# Patient Record
Sex: Female | Born: 1960 | Race: White | Hispanic: No | Marital: Married | Smoking: Never smoker
Health system: Southern US, Community
[De-identification: ages and names within clinical notes are randomized; demographics above are authoritative.]

## PROBLEM LIST (undated history)

## (undated) DIAGNOSIS — E063 Autoimmune thyroiditis: Secondary | ICD-10-CM

## (undated) DIAGNOSIS — G43909 Migraine, unspecified, not intractable, without status migrainosus: Secondary | ICD-10-CM

## (undated) DIAGNOSIS — K5792 Diverticulitis of intestine, part unspecified, without perforation or abscess without bleeding: Secondary | ICD-10-CM

## (undated) DIAGNOSIS — C801 Malignant (primary) neoplasm, unspecified: Secondary | ICD-10-CM

## (undated) HISTORY — DX: Diverticulitis of intestine, part unspecified, without perforation or abscess without bleeding: K57.92

## (undated) HISTORY — PX: BREAST CYST ASPIRATION: SHX578

## (undated) HISTORY — DX: Migraine, unspecified, not intractable, without status migrainosus: G43.909

## (undated) HISTORY — PX: VAGINAL HYSTERECTOMY: SUR661

## (undated) HISTORY — DX: Autoimmune thyroiditis: E06.3

## (undated) HISTORY — PX: HAND SURGERY: SHX662

## (undated) HISTORY — PX: GALLBLADDER SURGERY: SHX652

## (undated) HISTORY — DX: Malignant (primary) neoplasm, unspecified: C80.1

## (undated) HISTORY — PX: HERNIA REPAIR: SHX51

---

## 1997-06-11 ENCOUNTER — Encounter: Admission: RE | Admit: 1997-06-11 | Discharge: 1997-09-09 | Payer: Self-pay | Admitting: Specialist

## 1998-01-11 ENCOUNTER — Encounter: Payer: Self-pay | Admitting: Emergency Medicine

## 1998-01-11 ENCOUNTER — Emergency Department (HOSPITAL_COMMUNITY): Admission: EM | Admit: 1998-01-11 | Discharge: 1998-01-11 | Payer: Self-pay | Admitting: Emergency Medicine

## 1998-06-30 ENCOUNTER — Other Ambulatory Visit: Admission: RE | Admit: 1998-06-30 | Discharge: 1998-06-30 | Payer: Self-pay | Admitting: Obstetrics and Gynecology

## 1998-12-26 ENCOUNTER — Other Ambulatory Visit: Admission: RE | Admit: 1998-12-26 | Discharge: 1998-12-26 | Payer: Self-pay | Admitting: Obstetrics and Gynecology

## 1999-05-23 ENCOUNTER — Other Ambulatory Visit: Admission: RE | Admit: 1999-05-23 | Discharge: 1999-05-23 | Payer: Self-pay | Admitting: Obstetrics and Gynecology

## 1999-11-08 ENCOUNTER — Other Ambulatory Visit: Admission: RE | Admit: 1999-11-08 | Discharge: 1999-11-08 | Payer: Self-pay | Admitting: Obstetrics and Gynecology

## 2000-05-10 ENCOUNTER — Other Ambulatory Visit: Admission: RE | Admit: 2000-05-10 | Discharge: 2000-05-10 | Payer: Self-pay | Admitting: Obstetrics and Gynecology

## 2001-05-12 ENCOUNTER — Other Ambulatory Visit: Admission: RE | Admit: 2001-05-12 | Discharge: 2001-05-12 | Payer: Self-pay | Admitting: Obstetrics and Gynecology

## 2001-05-19 ENCOUNTER — Encounter: Admission: RE | Admit: 2001-05-19 | Discharge: 2001-05-19 | Payer: Self-pay | Admitting: Obstetrics and Gynecology

## 2001-05-19 ENCOUNTER — Encounter: Payer: Self-pay | Admitting: Obstetrics and Gynecology

## 2001-11-13 ENCOUNTER — Encounter: Admission: RE | Admit: 2001-11-13 | Discharge: 2001-11-13 | Payer: Self-pay | Admitting: Obstetrics and Gynecology

## 2001-11-13 ENCOUNTER — Encounter: Payer: Self-pay | Admitting: Obstetrics and Gynecology

## 2001-11-13 ENCOUNTER — Other Ambulatory Visit: Admission: RE | Admit: 2001-11-13 | Discharge: 2001-11-13 | Payer: Self-pay | Admitting: Radiology

## 2002-05-18 ENCOUNTER — Other Ambulatory Visit: Admission: RE | Admit: 2002-05-18 | Discharge: 2002-05-18 | Payer: Self-pay | Admitting: Obstetrics and Gynecology

## 2002-10-21 ENCOUNTER — Encounter: Admission: RE | Admit: 2002-10-21 | Discharge: 2002-10-21 | Payer: Self-pay | Admitting: Obstetrics and Gynecology

## 2002-10-21 ENCOUNTER — Encounter: Payer: Self-pay | Admitting: Obstetrics and Gynecology

## 2003-06-04 ENCOUNTER — Other Ambulatory Visit: Admission: RE | Admit: 2003-06-04 | Discharge: 2003-06-04 | Payer: Self-pay | Admitting: Obstetrics and Gynecology

## 2003-12-20 ENCOUNTER — Encounter: Admission: RE | Admit: 2003-12-20 | Discharge: 2003-12-20 | Payer: Self-pay | Admitting: Obstetrics and Gynecology

## 2003-12-24 ENCOUNTER — Encounter: Admission: RE | Admit: 2003-12-24 | Discharge: 2003-12-24 | Payer: Self-pay | Admitting: Obstetrics and Gynecology

## 2004-06-30 ENCOUNTER — Other Ambulatory Visit: Admission: RE | Admit: 2004-06-30 | Discharge: 2004-06-30 | Payer: Self-pay | Admitting: Obstetrics and Gynecology

## 2005-01-11 ENCOUNTER — Encounter: Admission: RE | Admit: 2005-01-11 | Discharge: 2005-01-11 | Payer: Self-pay | Admitting: Obstetrics and Gynecology

## 2006-02-06 ENCOUNTER — Encounter: Admission: RE | Admit: 2006-02-06 | Discharge: 2006-02-06 | Payer: Self-pay | Admitting: Obstetrics and Gynecology

## 2007-04-10 ENCOUNTER — Encounter: Admission: RE | Admit: 2007-04-10 | Discharge: 2007-04-10 | Payer: Self-pay | Admitting: Obstetrics and Gynecology

## 2008-04-12 ENCOUNTER — Encounter: Admission: RE | Admit: 2008-04-12 | Discharge: 2008-04-12 | Payer: Self-pay | Admitting: Obstetrics and Gynecology

## 2009-04-14 ENCOUNTER — Encounter: Admission: RE | Admit: 2009-04-14 | Discharge: 2009-04-14 | Payer: Self-pay | Admitting: Obstetrics and Gynecology

## 2009-04-19 ENCOUNTER — Encounter: Admission: RE | Admit: 2009-04-19 | Discharge: 2009-04-19 | Payer: Self-pay | Admitting: Obstetrics and Gynecology

## 2010-02-19 ENCOUNTER — Encounter: Payer: Self-pay | Admitting: Obstetrics and Gynecology

## 2010-05-03 ENCOUNTER — Other Ambulatory Visit: Payer: Self-pay | Admitting: Obstetrics and Gynecology

## 2010-05-03 DIAGNOSIS — Z1231 Encounter for screening mammogram for malignant neoplasm of breast: Secondary | ICD-10-CM

## 2010-06-06 ENCOUNTER — Ambulatory Visit
Admission: RE | Admit: 2010-06-06 | Discharge: 2010-06-06 | Disposition: A | Discharge status: Home / Self Care | Payer: 59 | Source: Ambulatory Visit | Attending: Obstetrics and Gynecology | Admitting: Obstetrics and Gynecology

## 2010-06-06 DIAGNOSIS — Z1231 Encounter for screening mammogram for malignant neoplasm of breast: Secondary | ICD-10-CM

## 2011-01-03 ENCOUNTER — Other Ambulatory Visit: Payer: Self-pay | Admitting: Internal Medicine

## 2011-01-03 DIAGNOSIS — R42 Dizziness and giddiness: Secondary | ICD-10-CM

## 2011-01-03 DIAGNOSIS — R51 Headache: Secondary | ICD-10-CM

## 2011-01-07 ENCOUNTER — Ambulatory Visit
Admission: RE | Admit: 2011-01-07 | Discharge: 2011-01-07 | Disposition: A | Discharge status: Home / Self Care | Payer: 59 | Source: Ambulatory Visit | Attending: Internal Medicine | Admitting: Internal Medicine

## 2011-01-07 DIAGNOSIS — R51 Headache: Secondary | ICD-10-CM

## 2011-01-07 DIAGNOSIS — R42 Dizziness and giddiness: Secondary | ICD-10-CM

## 2011-01-07 MED ORDER — GADOBENATE DIMEGLUMINE 529 MG/ML IV SOLN
17.0000 mL | Freq: Once | INTRAVENOUS | Status: AC | PRN
  Administered 2011-01-07: 17 mL via INTRAVENOUS

## 2011-01-23 ENCOUNTER — Ambulatory Visit (INDEPENDENT_AMBULATORY_CARE_PROVIDER_SITE_OTHER): Payer: 59

## 2011-01-23 DIAGNOSIS — J029 Acute pharyngitis, unspecified: Secondary | ICD-10-CM

## 2011-01-23 DIAGNOSIS — R509 Fever, unspecified: Secondary | ICD-10-CM

## 2011-01-23 DIAGNOSIS — IMO0001 Reserved for inherently not codable concepts without codable children: Secondary | ICD-10-CM

## 2011-05-25 ENCOUNTER — Other Ambulatory Visit: Payer: Self-pay | Admitting: Obstetrics and Gynecology

## 2011-05-25 DIAGNOSIS — Z1231 Encounter for screening mammogram for malignant neoplasm of breast: Secondary | ICD-10-CM

## 2011-07-02 ENCOUNTER — Ambulatory Visit
Admission: RE | Admit: 2011-07-02 | Discharge: 2011-07-02 | Disposition: A | Discharge status: Home / Self Care | Payer: 59 | Source: Ambulatory Visit | Attending: Obstetrics and Gynecology | Admitting: Obstetrics and Gynecology

## 2011-07-02 DIAGNOSIS — Z1231 Encounter for screening mammogram for malignant neoplasm of breast: Secondary | ICD-10-CM

## 2011-07-05 ENCOUNTER — Other Ambulatory Visit: Payer: Self-pay | Admitting: Obstetrics and Gynecology

## 2011-07-05 DIAGNOSIS — R928 Other abnormal and inconclusive findings on diagnostic imaging of breast: Secondary | ICD-10-CM

## 2011-07-09 ENCOUNTER — Ambulatory Visit
Admission: RE | Admit: 2011-07-09 | Discharge: 2011-07-09 | Disposition: A | Discharge status: Home / Self Care | Payer: 59 | Source: Ambulatory Visit | Attending: Obstetrics and Gynecology | Admitting: Obstetrics and Gynecology

## 2011-07-09 DIAGNOSIS — R928 Other abnormal and inconclusive findings on diagnostic imaging of breast: Secondary | ICD-10-CM

## 2011-07-13 ENCOUNTER — Other Ambulatory Visit: Payer: 59

## 2012-06-11 ENCOUNTER — Other Ambulatory Visit: Payer: Self-pay

## 2012-06-11 DIAGNOSIS — Z1231 Encounter for screening mammogram for malignant neoplasm of breast: Secondary | ICD-10-CM

## 2012-07-24 ENCOUNTER — Ambulatory Visit
Admission: RE | Admit: 2012-07-24 | Discharge: 2012-07-24 | Disposition: A | Discharge status: Home / Self Care | Payer: 59 | Source: Ambulatory Visit

## 2012-07-24 DIAGNOSIS — Z1231 Encounter for screening mammogram for malignant neoplasm of breast: Secondary | ICD-10-CM

## 2013-06-16 ENCOUNTER — Other Ambulatory Visit: Payer: Self-pay

## 2013-06-16 DIAGNOSIS — Z1231 Encounter for screening mammogram for malignant neoplasm of breast: Secondary | ICD-10-CM

## 2013-07-27 ENCOUNTER — Encounter (INDEPENDENT_AMBULATORY_CARE_PROVIDER_SITE_OTHER): Payer: Self-pay

## 2013-07-27 ENCOUNTER — Ambulatory Visit
Admission: RE | Admit: 2013-07-27 | Discharge: 2013-07-27 | Disposition: A | Discharge status: Home / Self Care | Payer: 59 | Source: Ambulatory Visit

## 2013-07-27 DIAGNOSIS — Z1231 Encounter for screening mammogram for malignant neoplasm of breast: Secondary | ICD-10-CM

## 2013-11-13 ENCOUNTER — Other Ambulatory Visit: Payer: Self-pay

## 2014-08-23 ENCOUNTER — Other Ambulatory Visit: Payer: Self-pay | Admitting: Obstetrics and Gynecology

## 2014-08-23 DIAGNOSIS — Z1231 Encounter for screening mammogram for malignant neoplasm of breast: Secondary | ICD-10-CM

## 2014-09-27 ENCOUNTER — Ambulatory Visit
Admission: RE | Admit: 2014-09-27 | Discharge: 2014-09-27 | Disposition: A | Discharge status: Home / Self Care | Payer: 59 | Source: Ambulatory Visit | Attending: Obstetrics and Gynecology | Admitting: Obstetrics and Gynecology

## 2014-09-27 DIAGNOSIS — Z1231 Encounter for screening mammogram for malignant neoplasm of breast: Secondary | ICD-10-CM

## 2014-09-30 ENCOUNTER — Other Ambulatory Visit: Payer: Self-pay | Admitting: Obstetrics and Gynecology

## 2014-09-30 DIAGNOSIS — R928 Other abnormal and inconclusive findings on diagnostic imaging of breast: Secondary | ICD-10-CM

## 2014-10-07 ENCOUNTER — Ambulatory Visit
Admission: RE | Admit: 2014-10-07 | Discharge: 2014-10-07 | Disposition: A | Discharge status: Home / Self Care | Payer: 59 | Source: Ambulatory Visit | Attending: Obstetrics and Gynecology | Admitting: Obstetrics and Gynecology

## 2014-10-07 DIAGNOSIS — R928 Other abnormal and inconclusive findings on diagnostic imaging of breast: Secondary | ICD-10-CM

## 2015-09-30 ENCOUNTER — Other Ambulatory Visit: Payer: Self-pay | Admitting: Internal Medicine

## 2015-09-30 DIAGNOSIS — Z1231 Encounter for screening mammogram for malignant neoplasm of breast: Secondary | ICD-10-CM

## 2015-10-25 ENCOUNTER — Ambulatory Visit
Admission: RE | Admit: 2015-10-25 | Discharge: 2015-10-25 | Disposition: A | Discharge status: Home / Self Care | Payer: 59 | Source: Ambulatory Visit | Attending: Internal Medicine | Admitting: Internal Medicine

## 2015-10-25 DIAGNOSIS — Z1231 Encounter for screening mammogram for malignant neoplasm of breast: Secondary | ICD-10-CM

## 2016-11-01 ENCOUNTER — Other Ambulatory Visit: Payer: Self-pay | Admitting: Obstetrics and Gynecology

## 2016-11-01 DIAGNOSIS — Z1231 Encounter for screening mammogram for malignant neoplasm of breast: Secondary | ICD-10-CM

## 2016-11-07 ENCOUNTER — Ambulatory Visit (INDEPENDENT_AMBULATORY_CARE_PROVIDER_SITE_OTHER): Payer: 59 | Admitting: Neurology

## 2016-11-07 ENCOUNTER — Encounter: Payer: Self-pay | Admitting: Neurology

## 2016-11-07 VITALS — BP 142/80 | HR 69 | Ht 67.0 in | Wt 186.5 lb

## 2016-11-07 DIAGNOSIS — M2142 Flat foot [pes planus] (acquired), left foot: Secondary | ICD-10-CM | POA: Diagnosis not present

## 2016-11-07 DIAGNOSIS — R29898 Other symptoms and signs involving the musculoskeletal system: Secondary | ICD-10-CM

## 2016-11-07 NOTE — Progress Notes (Signed)
Reason for visit: Left great toe weakness  Referring physician: Dr. Kris Hartmann I Erica Thornton is a 56 y.o. female  History of present illness:  Erica Thornton is a 56 year old right-handed white female with a history of sudden onset of right great toe extensor weakness that began around the end of May 2018. The onset of the symptoms were associated with discomfort at the anterior portion of the ankle and some slight swelling and possibly some slight bruising. The patient was seen by Dr. Doran Thornton at [facility], she was felt to have a possible tendon rupture of the extensor pollicis longus muscle. MRI of the foot was done, this did not show any abnormalities that would explain the toe weakness. The patient denied any numbness of the foot. She does have a history of some back pain and some pain down into the posterior left leg. She underwent MRI of the lumbosacral spine that was reviewed on disc. This is relatively unremarkable, no evidence of nerve root impingement were seen at any level. The patient was seen by Dr. Tonita Thornton, and the patient was referred to this office for further evaluation. Over time, the strength of the great toe on the left has improved, the patient is now able to extend the toe, but there is still some weakness. She does not have a footdrop.  Past Medical History:  Diagnosis Date  . Cancer ([place])   . Diverticulitis   . Hashimoto's disease   . Migraine     Past Surgical History:  Procedure Laterality Date  . GALLBLADDER SURGERY    . HAND SURGERY    . HERNIA REPAIR    . VAGINAL HYSTERECTOMY      History reviewed. No pertinent family history.  Social history:  reports that she has never smoked. She has never used smokeless tobacco. She reports that she does not drink alcohol or use drugs.  Medications:  Prior to Admission medications   Medication Sig Start Date End Date Taking? Authorizing Provider  Calcium Carb-Cholecalciferol 600-800 MG-UNIT CHEW  06/09/15  Yes  [provider]  calcium carbonate (CALCIUM 600) 600 MG TABS tablet Take 1 tablet by mouth daily.   Yes [provider]  Cholecalciferol (VITAMIN D3 PO) Take 2,000 Units by mouth daily.   Yes [provider]  levothyroxine (SYNTHROID, LEVOTHROID) 75 MCG tablet Take 75 mcg by mouth daily. 05/15/16  Yes [provider]  loratadine-pseudoephedrine (CLARITIN-D 12-HOUR) 5-120 MG tablet Take 1 tablet by mouth 2 (two) times daily.   Yes [provider]  Meclizine HCl 25 MG CHEW Chew 25 mg by mouth.   Yes [provider]  psyllium (METAMUCIL) 58.6 % packet Take 1 packet by mouth daily.   Yes [provider]  vitamin C (ASCORBIC ACID) 500 MG tablet Take 500 mg by mouth daily.   Yes [provider]     No Known Allergies  ROS:  Out of a complete 14 system review of symptoms, the patient complains only of the following symptoms, and all other reviewed systems are negative.  Fatigue Ringing in the ears, dizziness Birthmarks, moles Snoring Feeling hot, flushing Joint pain Allergies, runny nose Headache, numbness, dizziness, history of single seizure in the past Restless legs   Blood pressure (!) 142/80, pulse 69, height 5\' 7"  (1.702 m), weight 186 lb 8 oz (84.6 kg), SpO2 98 %.  Physical Exam  General: The patient is alert and cooperative at the time of the examination.  Eyes: Pupils are  equal, round, and reactive to light. Discs are flat bilaterally.  Neck: The neck is supple, no carotid bruits are noted.  Respiratory: The respiratory examination is clear.  Cardiovascular: The cardiovascular examination reveals a regular rate and rhythm, no obvious murmurs or rubs are noted.  Skin: Extremities are without significant edema.  Neurologic Exam  Mental status: The patient is alert and oriented x 3 at the time of the examination. The patient has apparent normal recent and remote memory, with an apparently normal  attention span and concentration ability.  Cranial nerves: Facial symmetry is present. There is good sensation of the face to pinprick and soft touch bilaterally. The strength of the facial muscles and the muscles to head turning and shoulder shrug are normal bilaterally. Speech is well enunciated, no aphasia or dysarthria is noted. Extraocular movements are full. Visual fields are full. The tongue is midline, and the patient has symmetric elevation of the soft palate. No obvious hearing deficits are noted.  Motor: The motor testing reveals 5 over 5 strength of all 4 extremities, with exception of some weakness with extension of the great toe on the left. Good symmetric motor tone is noted throughout.  Sensory: Sensory testing is intact to pinprick, soft touch, vibration sensation, and position sense on all 4 extremities. No evidence of extinction is noted.  Coordination: Cerebellar testing reveals good finger-nose-finger and heel-to-shin bilaterally.  Gait and station: Gait is normal. Tandem gait is normal. Romberg is negative. No drift is seen.The patient is able to walk on heels and the toes bilaterally.  Reflexes: Deep tendon reflexes are symmetric and normal bilaterally. Toes are downgoing bilaterally.   Assessment/Plan:  1. Left great toe weakness   The patient has some weakness of extension of the left great toe, this is improving over time. The patient could potentially have a nerve impingement of a branch of the deep peroneal nerve resulting in the dysfunction of the left great toe, but the patient is improving. We discussed potentially getting EMG nerve conduction study, but we have decided to hold off on the study at this time and pursue further evaluation only if the patient does not continue to improve. There is no evidence of a peripheral neuropathy on clinical examination, the patient has not had an overt left footdrop.   Jill Alexanders MD 11/07/2016 9:41 AM  [facility] [address] [place], [place] [address]  Phone [phone] Fax [phone]

## 2016-11-07 NOTE — Patient Instructions (Signed)
   We will check EMG and NCV of the left foot.

## 2016-12-03 ENCOUNTER — Ambulatory Visit
Admission: RE | Admit: 2016-12-03 | Discharge: 2016-12-03 | Disposition: A | Discharge status: Home / Self Care | Payer: 59 | Source: Ambulatory Visit | Attending: Obstetrics and Gynecology | Admitting: Obstetrics and Gynecology

## 2016-12-03 DIAGNOSIS — Z1231 Encounter for screening mammogram for malignant neoplasm of breast: Secondary | ICD-10-CM

## 2017-11-01 ENCOUNTER — Other Ambulatory Visit: Payer: Self-pay | Admitting: Obstetrics and Gynecology

## 2017-11-01 DIAGNOSIS — Z1231 Encounter for screening mammogram for malignant neoplasm of breast: Secondary | ICD-10-CM

## 2017-12-11 ENCOUNTER — Ambulatory Visit: Payer: 59

## 2018-01-13 ENCOUNTER — Ambulatory Visit
Admission: RE | Admit: 2018-01-13 | Discharge: 2018-01-13 | Disposition: A | Discharge status: Home / Self Care | Payer: 59 | Source: Ambulatory Visit | Attending: Obstetrics and Gynecology | Admitting: Obstetrics and Gynecology

## 2018-01-13 DIAGNOSIS — Z1231 Encounter for screening mammogram for malignant neoplasm of breast: Secondary | ICD-10-CM

## 2018-03-28 ENCOUNTER — Ambulatory Visit
Admission: RE | Admit: 2018-03-28 | Discharge: 2018-03-28 | Disposition: A | Discharge status: Home / Self Care | Payer: 59 | Source: Ambulatory Visit | Attending: Internal Medicine | Admitting: Internal Medicine

## 2018-03-28 ENCOUNTER — Other Ambulatory Visit: Payer: Self-pay | Admitting: Internal Medicine

## 2018-03-28 DIAGNOSIS — R52 Pain, unspecified: Secondary | ICD-10-CM

## 2019-01-01 ENCOUNTER — Other Ambulatory Visit: Payer: Self-pay | Admitting: Obstetrics and Gynecology

## 2019-01-01 DIAGNOSIS — Z1231 Encounter for screening mammogram for malignant neoplasm of breast: Secondary | ICD-10-CM

## 2019-02-23 ENCOUNTER — Other Ambulatory Visit: Payer: Self-pay

## 2019-02-23 ENCOUNTER — Ambulatory Visit
Admission: RE | Admit: 2019-02-23 | Discharge: 2019-02-23 | Disposition: A | Discharge status: Home / Self Care | Payer: 59 | Source: Ambulatory Visit | Attending: Obstetrics and Gynecology | Admitting: Obstetrics and Gynecology

## 2019-02-23 DIAGNOSIS — Z1231 Encounter for screening mammogram for malignant neoplasm of breast: Secondary | ICD-10-CM

## 2019-10-07 ENCOUNTER — Other Ambulatory Visit: Payer: Self-pay | Admitting: Internal Medicine

## 2019-10-07 ENCOUNTER — Ambulatory Visit
Admission: RE | Admit: 2019-10-07 | Discharge: 2019-10-07 | Disposition: A | Discharge status: Home / Self Care | Payer: 59 | Source: Ambulatory Visit | Attending: Internal Medicine | Admitting: Internal Medicine

## 2019-10-07 DIAGNOSIS — R509 Fever, unspecified: Secondary | ICD-10-CM

## 2019-10-07 DIAGNOSIS — R1032 Left lower quadrant pain: Secondary | ICD-10-CM

## 2019-10-07 MED ORDER — IOPAMIDOL (ISOVUE-300) INJECTION 61%
100.0000 mL | Freq: Once | INTRAVENOUS | Status: AC | PRN
  Administered 2019-10-07: 100 mL via INTRAVENOUS

## 2020-03-25 ENCOUNTER — Other Ambulatory Visit: Payer: Self-pay | Admitting: Obstetrics and Gynecology

## 2020-03-25 DIAGNOSIS — Z1231 Encounter for screening mammogram for malignant neoplasm of breast: Secondary | ICD-10-CM

## 2020-05-16 ENCOUNTER — Inpatient Hospital Stay: Admission: RE | Admit: 2020-05-16 | Payer: 59 | Source: Ambulatory Visit

## 2020-05-21 ENCOUNTER — Other Ambulatory Visit: Payer: Self-pay

## 2020-05-21 ENCOUNTER — Ambulatory Visit
Admission: RE | Admit: 2020-05-21 | Discharge: 2020-05-21 | Disposition: A | Discharge status: Home / Self Care | Payer: 59 | Source: Ambulatory Visit | Attending: Obstetrics and Gynecology | Admitting: Obstetrics and Gynecology

## 2020-05-21 DIAGNOSIS — Z1231 Encounter for screening mammogram for malignant neoplasm of breast: Secondary | ICD-10-CM

## 2021-05-17 ENCOUNTER — Other Ambulatory Visit: Payer: Self-pay | Admitting: Obstetrics and Gynecology

## 2021-05-17 DIAGNOSIS — Z1231 Encounter for screening mammogram for malignant neoplasm of breast: Secondary | ICD-10-CM

## 2021-05-23 ENCOUNTER — Ambulatory Visit: Payer: 59

## 2021-06-06 ENCOUNTER — Ambulatory Visit
Admission: RE | Admit: 2021-06-06 | Discharge: 2021-06-06 | Disposition: A | Discharge status: Home / Self Care | Payer: 59 | Source: Ambulatory Visit | Attending: Obstetrics and Gynecology | Admitting: Obstetrics and Gynecology

## 2021-06-06 DIAGNOSIS — Z1231 Encounter for screening mammogram for malignant neoplasm of breast: Secondary | ICD-10-CM

## 2021-06-07 ENCOUNTER — Other Ambulatory Visit: Payer: Self-pay | Admitting: Obstetrics and Gynecology

## 2021-06-07 DIAGNOSIS — R928 Other abnormal and inconclusive findings on diagnostic imaging of breast: Secondary | ICD-10-CM

## 2021-06-13 ENCOUNTER — Ambulatory Visit: Payer: 59

## 2021-06-13 ENCOUNTER — Ambulatory Visit
Admission: RE | Admit: 2021-06-13 | Discharge: 2021-06-13 | Disposition: A | Discharge status: Home / Self Care | Payer: 59 | Source: Ambulatory Visit | Attending: Obstetrics and Gynecology | Admitting: Obstetrics and Gynecology

## 2021-06-13 DIAGNOSIS — R928 Other abnormal and inconclusive findings on diagnostic imaging of breast: Secondary | ICD-10-CM

## 2022-03-02 ENCOUNTER — Other Ambulatory Visit: Payer: Self-pay | Admitting: Internal Medicine

## 2022-03-02 DIAGNOSIS — R509 Fever, unspecified: Secondary | ICD-10-CM

## 2022-03-02 DIAGNOSIS — R10817 Generalized abdominal tenderness: Secondary | ICD-10-CM

## 2022-03-02 DIAGNOSIS — K5733 Diverticulitis of large intestine without perforation or abscess with bleeding: Secondary | ICD-10-CM

## 2023-11-25 ENCOUNTER — Ambulatory Visit: Admitting: Orthopedic Surgery

## 2023-11-25 ENCOUNTER — Other Ambulatory Visit (INDEPENDENT_AMBULATORY_CARE_PROVIDER_SITE_OTHER)

## 2023-11-25 DIAGNOSIS — M25561 Pain in right knee: Secondary | ICD-10-CM | POA: Diagnosis not present

## 2023-11-25 DIAGNOSIS — M25562 Pain in left knee: Secondary | ICD-10-CM

## 2023-11-25 DIAGNOSIS — M170 Bilateral primary osteoarthritis of knee: Secondary | ICD-10-CM | POA: Diagnosis not present

## 2023-11-26 ENCOUNTER — Encounter: Payer: Self-pay | Admitting: Orthopedic Surgery

## 2023-11-26 NOTE — Progress Notes (Unsigned)
 Office Visit Note   Patient: Erica Thornton           Date of Birth: 12-Jun-1960           MRN: [id number] Visit Date: 11/25/2023 Requested by: Valma Carwin, MD [id number] [place],  [place] [address] PCP: Valma Carwin, MD  Subjective: Chief Complaint  Patient presents with   Other    Bilateral knee pain    HPI: Erica Thornton is a 63 y.o. female who presents to the office reporting bilateral knee pain right worse than left.  No prior surgery.  Patient states she has had pain in her knees for her entire adult life.  Pain has been worse this year.  Does report some stiffness in the knee after sitting.  She actually fell in college down 3 or 4 stairs with direct impact on both knees at that time.  Currently she describes swelling and popping but no discrete locking.  Does report a lot of grinding with motion.  Patient did have major surgery last year and she is not really up for any surgery.  She takes occasional anti-inflammatories.  Going downstairs is the most symptomatic event for her.  She does have about 30 minutes of walking endurance..                ROS: All systems reviewed are negative as they relate to the chief complaint within the history of present illness.  Patient denies fevers or chills.  Assessment & Plan: Visit Diagnoses:  1. Pain in both knees, unspecified chronicity     Plan: Impression is bilateral knee pain right worse than left.  Plan is cortisone injection today.  She does have some bone-on-bone arthritis particular on that right-hand side in the medial compartment.  Will preapproved for for gel and could consider gel plus PRP as a next injection.  Have had good results with that combination and patient's the past several years.  Follow-up for that injection once the cortisone wears off.  Follow-Up Instructions: No follow-ups on file.   Orders:  Orders Placed This Encounter  Procedures   XR Knee 1-2 Views Right   XR Knee 1-2 Views Left   Ambulatory request  for injection medication   No orders of the defined types were placed in this encounter.     Procedures: Large Joint Inj: R knee on 11/25/2023 11:14 AM Indications: diagnostic evaluation, joint swelling and pain Details: 18 G 1.5 in needle, superolateral approach  Arthrogram: No  Medications: 5 mL lidocaine 1 %; 4 mL bupivacaine 0.25 %; 40 mg triamcinolone acetonide 40 MG/ML Outcome: tolerated well, no immediate complications Procedure, treatment alternatives, risks and benefits explained, specific risks discussed. Consent was given by the patient. Immediately prior to procedure a time out was called to verify the correct patient, procedure, equipment, support staff and site/side marked as required. Patient was prepped and draped in the usual sterile fashion.       Clinical Data: No additional findings.  Objective: Vital Signs: There were no vitals taken for this visit.  Physical Exam:  Constitutional: Patient appears well-developed HEENT:  Head: Normocephalic Eyes:EOM are normal Neck: Normal range of motion Cardiovascular: Normal rate Pulmonary/chest: Effort normal Neurologic: Patient is alert Skin: Skin is warm Psychiatric: Patient has normal mood and affect  Ortho Exam: Ortho exam demonstrates full range of motion bilateral knees with no effusion.  Collateral cruciate ligaments are stable bilaterally.  Has medial greater than lateral joint line tenderness in the right  and left knee.  No groin pain with internal/external rotation of the leg.  Ankle dorsiflexion intact.  Pedal pulses palpable.  No masses lymphadenopathy or skin changes noted in that right knee region.This patient is diagnosed with osteoarthritis of the knee(s).    Radiographs show evidence of joint space narrowing, osteophytes, subchondral sclerosis and/or subchondral cysts.  This patient has knee pain which interferes with functional and activities of daily living.    This patient has experienced  inadequate response, adverse effects and/or intolerance with conservative treatments such as acetaminophen, NSAIDS, topical creams, physical therapy or regular exercise, knee bracing and/or weight loss.   This patient has experienced inadequate response or has a contraindication to intra articular steroid injections for at least 3 months.   This patient is not scheduled to have a total knee replacement within 6 months of starting treatment with viscosupplementation.   Specialty Comments:  No specialty comments available.  Imaging: XR Knee 1-2 Views Left Result Date: 11/26/2023 AP lateral radiographs left knee reviewed.  Moderate medial compartment arthritis is present with mild to moderate lateral and patellofemoral compartment arthritis.  Slight varus alignment noted.  No acute fracture  XR Knee 1-2 Views Right Result Date: 11/26/2023 AP lateral radiographs right knee reviewed.  Severe end-stage arthritis is present worse in the medial compartment with only about 1 mm of joint space remaining.  Lateral and patellofemoral compartments have moderate arthritis.  Slight varus alignment noted.  No acute fracture    PMFS History: There are no active problems to display for this patient.  Past Medical History:  Diagnosis Date   Cancer ([facility])    Diverticulitis    Hashimoto's disease    Migraine     Family History  Problem Relation Age of Onset   Breast cancer Maternal Aunt    Breast cancer Paternal Aunt    Breast cancer Paternal Grandmother     Past Surgical History:  Procedure Laterality Date   BREAST CYST ASPIRATION     GALLBLADDER SURGERY     HAND SURGERY     HERNIA REPAIR     VAGINAL HYSTERECTOMY     Social History   Occupational History   Not on file  Tobacco Use   Smoking status: Never   Smokeless tobacco: Never  Substance and Sexual Activity   Alcohol use: No   Drug use: No   Sexual activity: Not on file

## 2023-11-28 MED ORDER — LIDOCAINE HCL 1 % IJ SOLN
5.0000 mL | INTRAMUSCULAR | Status: AC | PRN
  Administered 2023-11-25: 5 mL

## 2023-11-28 MED ORDER — TRIAMCINOLONE ACETONIDE 40 MG/ML IJ SUSP
40.0000 mg | INTRAMUSCULAR | Status: AC | PRN
  Administered 2023-11-25: 40 mg via INTRA_ARTICULAR

## 2023-11-28 MED ORDER — BUPIVACAINE HCL 0.25 % IJ SOLN
4.0000 mL | INTRAMUSCULAR | Status: AC | PRN
  Administered 2023-11-25: 4 mL via INTRA_ARTICULAR

## 2023-11-29 ENCOUNTER — Telehealth: Payer: Self-pay

## 2023-11-29 NOTE — Telephone Encounter (Signed)
 Yes I would agree that this is likely a cortisone flare reaction and I would take combination of Tylenol and Aleve and see if this will help.  If no improvement by Monday, feel free to reach out to us .

## 2023-11-29 NOTE — Telephone Encounter (Signed)
 Patient stating that her right knee was hurting really bad yesterday.  Stated that it's no redness or no fever and took an aleve. Wanted to know if this is normal?  Cb# [phone].  Please advise.  Thank you

## 2023-12-02 ENCOUNTER — Encounter: Payer: Self-pay | Admitting: Radiology

## 2023-12-04 ENCOUNTER — Telehealth: Payer: Self-pay

## 2023-12-04 NOTE — Telephone Encounter (Signed)
 Come in 3 weeks thx with me

## 2023-12-04 NOTE — Telephone Encounter (Signed)
 I called pt and scheduled appt -she is aware of the $500 charge

## 2023-12-04 NOTE — Telephone Encounter (Signed)
 Patient would like to have PRP only. Declined for gel injection due to reaction from cortisone injection.  Would like to know how long will she have to wait to have PRP for right knee.  CB# [phone].  Please advise.  Thank you.

## 2023-12-10 ENCOUNTER — Other Ambulatory Visit: Payer: Self-pay | Admitting: Obstetrics and Gynecology

## 2023-12-10 ENCOUNTER — Other Ambulatory Visit: Payer: Self-pay | Admitting: Family

## 2023-12-10 DIAGNOSIS — N6452 Nipple discharge: Secondary | ICD-10-CM

## 2023-12-17 ENCOUNTER — Ambulatory Visit
Admission: RE | Admit: 2023-12-17 | Discharge: 2023-12-17 | Disposition: A | Discharge status: Home / Self Care | Source: Ambulatory Visit | Attending: Obstetrics and Gynecology | Admitting: Obstetrics and Gynecology

## 2023-12-17 DIAGNOSIS — N6452 Nipple discharge: Secondary | ICD-10-CM

## 2023-12-25 ENCOUNTER — Other Ambulatory Visit: Payer: Self-pay | Admitting: Family

## 2023-12-25 DIAGNOSIS — N6452 Nipple discharge: Secondary | ICD-10-CM

## 2023-12-30 ENCOUNTER — Ambulatory Visit: Admitting: Orthopedic Surgery

## 2023-12-30 DIAGNOSIS — M170 Bilateral primary osteoarthritis of knee: Secondary | ICD-10-CM

## 2023-12-30 DIAGNOSIS — M25561 Pain in right knee: Secondary | ICD-10-CM

## 2023-12-31 ENCOUNTER — Encounter: Payer: Self-pay | Admitting: Orthopedic Surgery

## 2023-12-31 NOTE — Progress Notes (Unsigned)
   Procedure Note  Patient: Erica Thornton             Date of Birth: April 02, 1960           MRN: [id number]             Visit Date: 12/30/2023  Procedures: Visit Diagnoses:  1. Right knee pain, unspecified chronicity     Large Joint Inj: R knee on 12/31/2023 3:01 PM Indications: diagnostic evaluation, joint swelling and pain Details: 18 G 1.5 in needle, superolateral approach  Arthrogram: No  Outcome: tolerated well, no immediate complications Procedure, treatment alternatives, risks and benefits explained, specific risks discussed. Consent was given by the patient. Immediately prior to procedure a time out was called to verify the correct patient, procedure, equipment, support staff and site/side marked as required. Patient was prepped and draped in the usual sterile fashion.    5 cc PRP injected

## 2024-01-01 ENCOUNTER — Encounter: Payer: Self-pay | Admitting: Orthopedic Surgery

## 2024-01-08 IMAGING — MG MM DIGITAL SCREENING BILAT W/ TOMO AND CAD
8 series · 8 of 24 positions shown · non-contrast
Comparison: Previous exam(s).

CLINICAL DATA: Screening.

EXAM:
DIGITAL SCREENING BILATERAL MAMMOGRAM WITH TOMOSYNTHESIS AND CAD
TECHNIQUE: Bilateral screening digital craniocaudal and mediolateral oblique
mammograms were obtained. Bilateral screening digital breast
tomosynthesis was performed. The images were evaluated with
computer-aided detection.

[R MLO synth-2D]
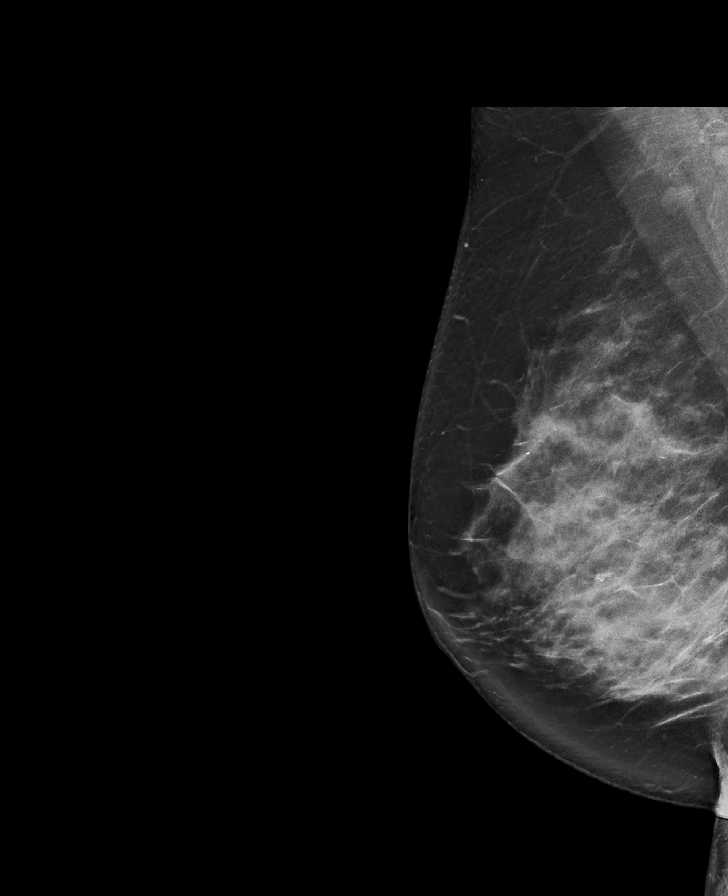

[L CC synth-2D]
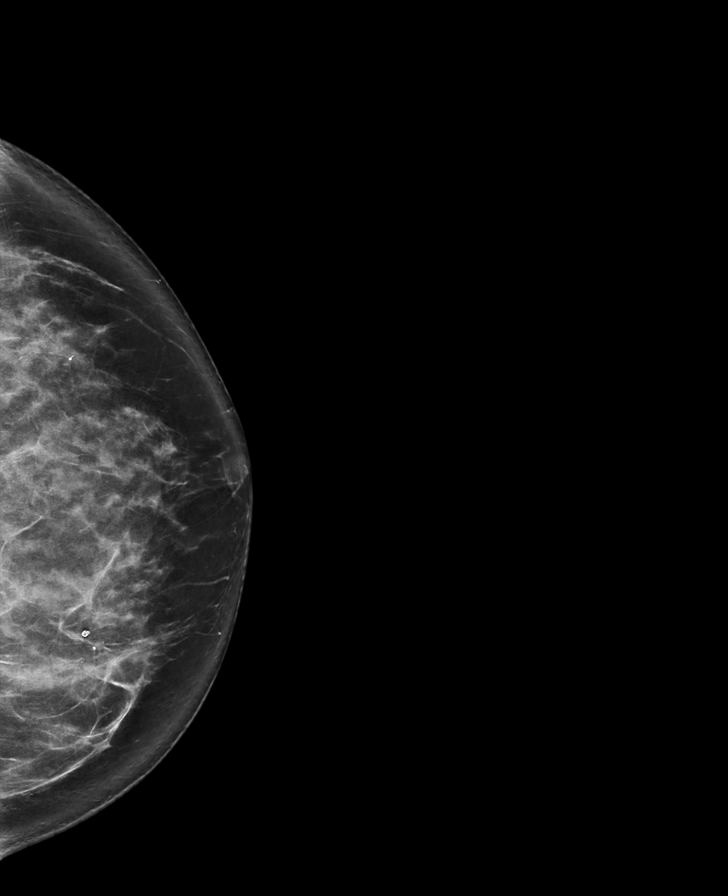

[L MLO synth-2D]
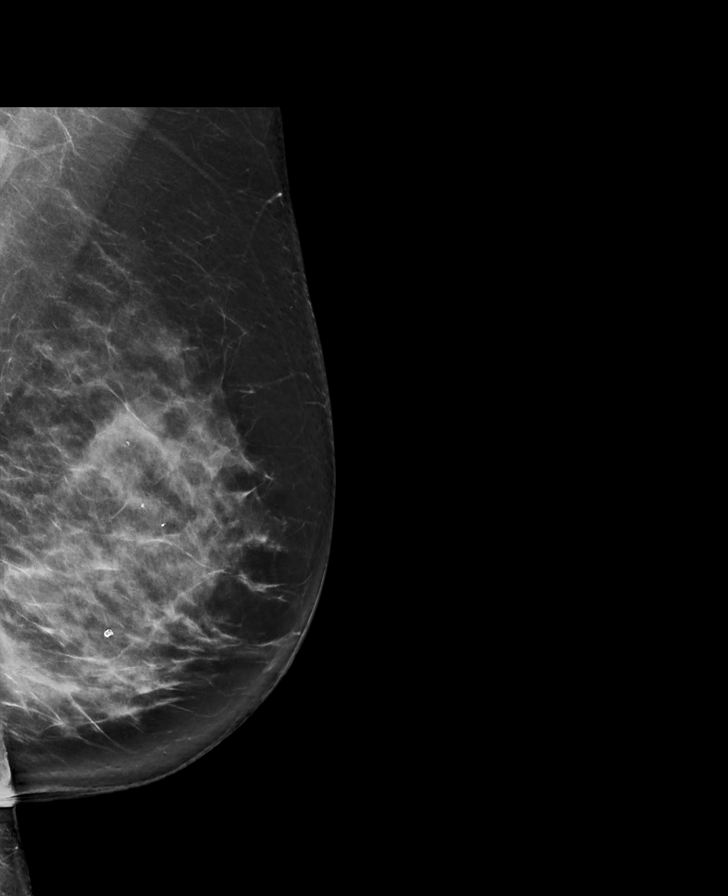

[R CC synth-2D]
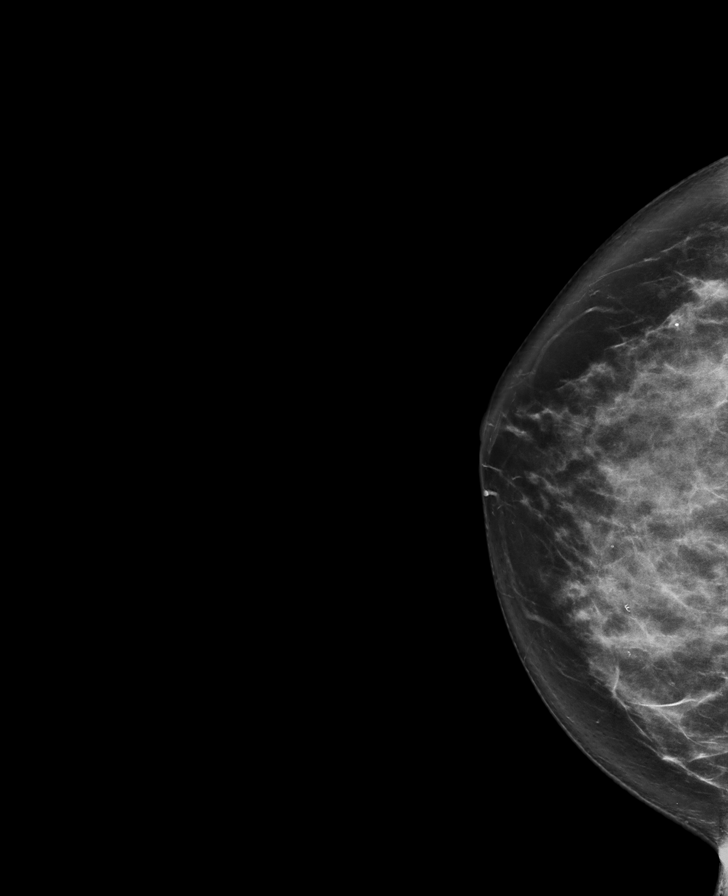

[R MLO tomo · tomo slice 52/103.0]
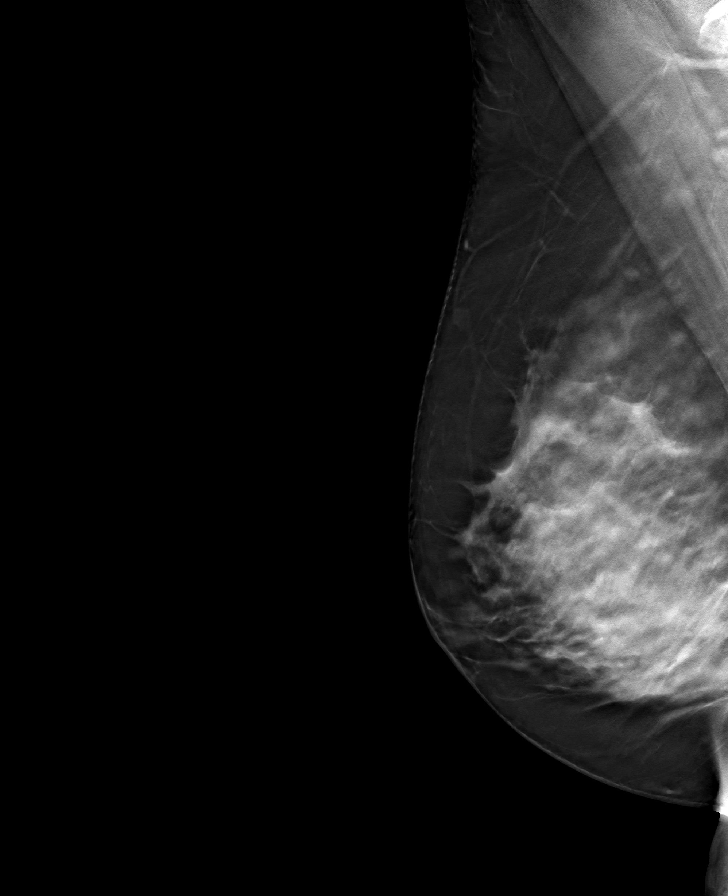

[L MLO tomo · tomo slice 50/99.0]
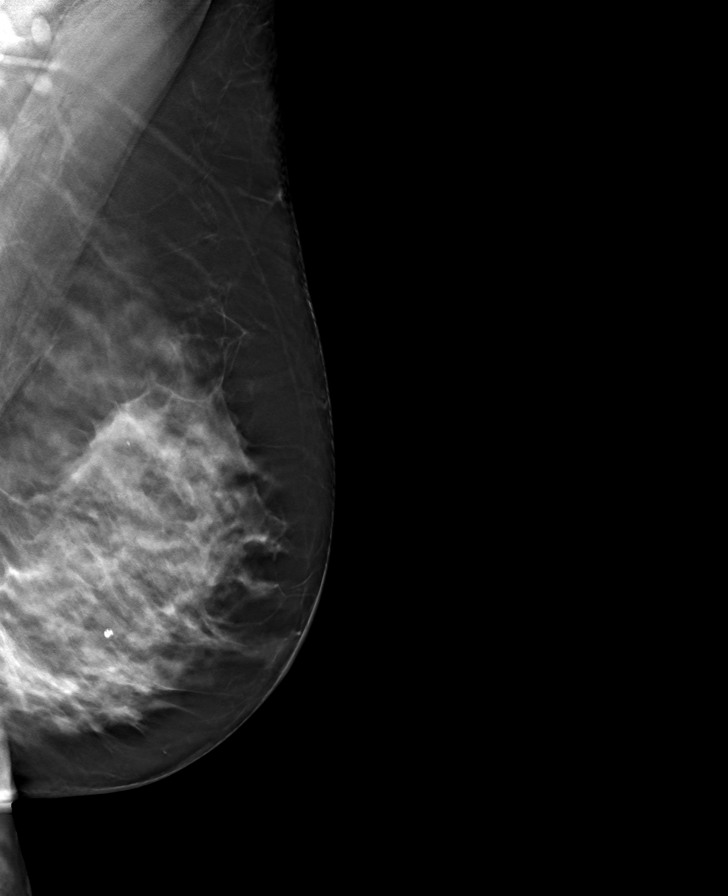

[R CC tomo · tomo slice 43/86.0]
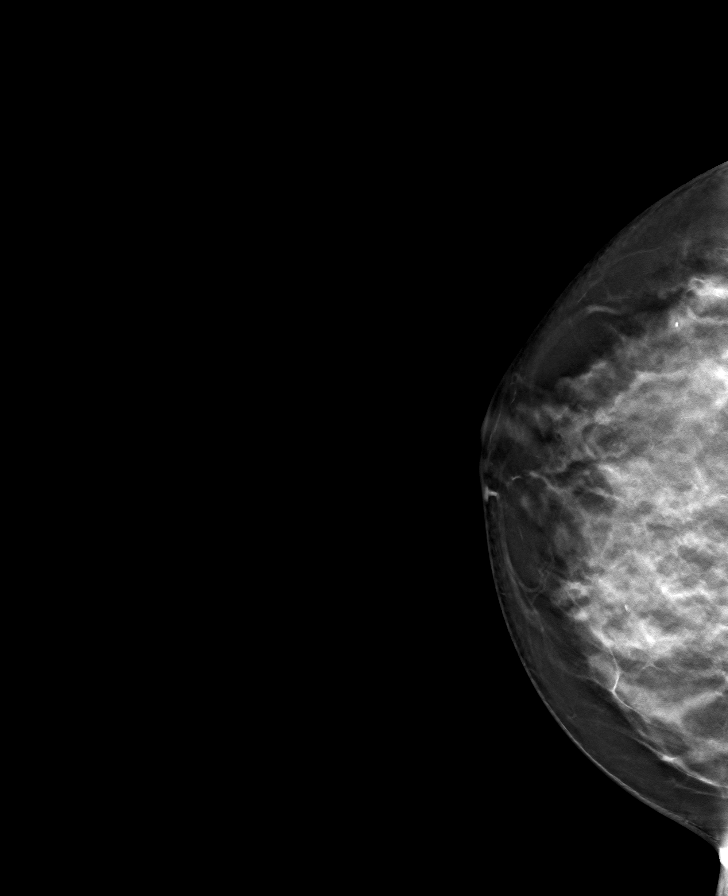

[L CC tomo · tomo slice 45/90.0]
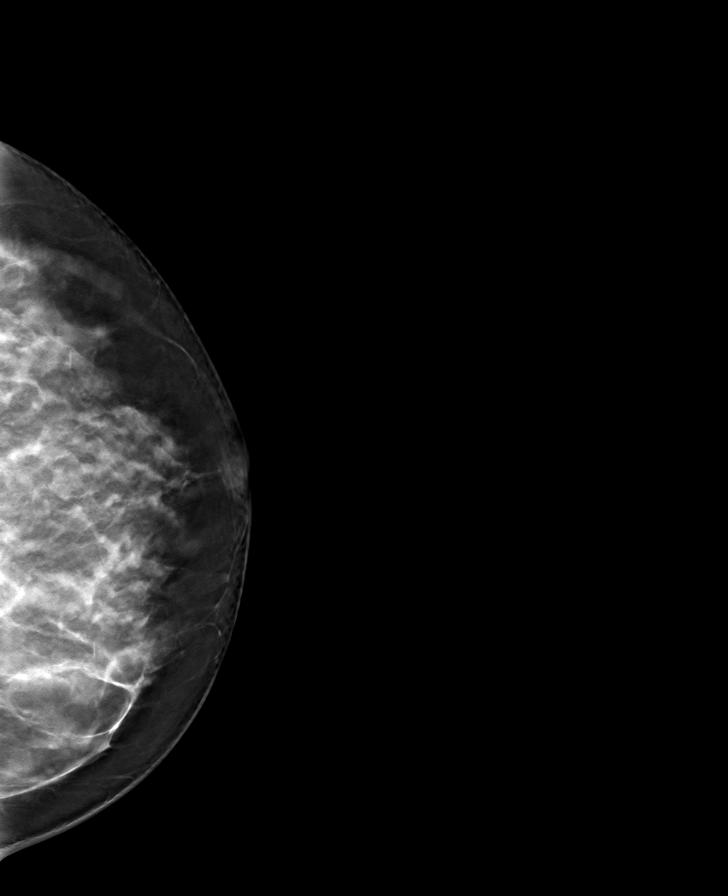

[8 of 24 positions shown; findings below may reference images not displayed]

ACR Breast Density Category c: The breast tissue is heterogeneously
dense, which may obscure small masses.
FINDINGS: In the right breast, a possible asymmetry warrants further
evaluation. In the left breast, no findings suspicious for
malignancy.
IMPRESSION: Further evaluation is suggested for possible asymmetry in the right
breast.

RECOMMENDATION:
Diagnostic mammogram and possibly ultrasound of the right breast.
(Code:06-Y-99A)

The patient will be contacted regarding the findings, and additional
imaging will be scheduled.

BI-RADS CATEGORY  0: Incomplete. Need additional imaging evaluation
and/or prior mammograms for comparison.

## 2024-01-17 ENCOUNTER — Ambulatory Visit: Admitting: Rehabilitative and Restorative Service Providers"

## 2024-01-17 DIAGNOSIS — R600 Localized edema: Secondary | ICD-10-CM | POA: Diagnosis not present

## 2024-01-17 DIAGNOSIS — G8929 Other chronic pain: Secondary | ICD-10-CM

## 2024-01-17 DIAGNOSIS — M25561 Pain in right knee: Secondary | ICD-10-CM

## 2024-01-17 DIAGNOSIS — R262 Difficulty in walking, not elsewhere classified: Secondary | ICD-10-CM

## 2024-01-17 DIAGNOSIS — M25562 Pain in left knee: Secondary | ICD-10-CM | POA: Diagnosis not present

## 2024-01-17 DIAGNOSIS — M6281 Muscle weakness (generalized): Secondary | ICD-10-CM | POA: Diagnosis not present

## 2024-01-17 DIAGNOSIS — M25661 Stiffness of right knee, not elsewhere classified: Secondary | ICD-10-CM

## 2024-01-17 NOTE — Therapy (Signed)
 " OUTPATIENT PHYSICAL THERAPY LOWER EXTREMITY EVALUATION   Patient Name: Erica Thornton MRN: [id number] DOB:22-May-1960, 63 y.o., female Today's Date: 01/17/2024  END OF SESSION:  PT End of Session - 01/17/24 1637     Visit Number 1    Number of Visits 16    Date for Recertification  03/13/24    Authorization Type [employer]    Progress Note Due on Visit 16    PT Start Time 1433    PT Stop Time 1517    PT Time Calculation (min) 44 min    Activity Tolerance Patient tolerated treatment well;No increased pain    Behavior During Therapy WFL for tasks assessed/performed          Past Medical History:  Diagnosis Date   Cancer (HCC)    Diverticulitis    Hashimoto's disease    Migraine    Past Surgical History:  Procedure Laterality Date   BREAST CYST ASPIRATION     GALLBLADDER SURGERY     HAND SURGERY     HERNIA REPAIR     VAGINAL HYSTERECTOMY     There are no active problems to display for this patient.   PCP: Gaither Gell, MD  REFERRING PROVIDER: Cordella Glendia Hutchinson, MD  REFERRING DIAG:  [id number] (ICD-10-CM) - Right knee pain, unspecified chronicity   THERAPY DIAG:  Difficulty in walking, not elsewhere classified - Plan: PT plan of care cert/re-cert  Localized edema - Plan: PT plan of care cert/re-cert  Muscle weakness (generalized) - Plan: PT plan of care cert/re-cert  Stiffness of right knee, not elsewhere classified - Plan: PT plan of care cert/re-cert  Chronic pain of left knee - Plan: PT plan of care cert/re-cert  Chronic pain of right knee - Plan: PT plan of care cert/re-cert  Rationale for Evaluation and Treatment: Rehabilitation  ONSET DATE: Chronic  SUBJECTIVE:   SUBJECTIVE STATEMENT: Erica Thornton has a history of right greater than left knee pain that has gotten to the point that she is a total knee replacement candidate.  Given her age of 63, she is trying to do everything she can to try to delay or avoid surgery.  She had PRP a few weeks ago and is still  complying with no anti-inflammatory recommendations.  PERTINENT HISTORY: Hashimoto's disease, migraines, cancer, very arthritic knees   PAIN:  Are you having pain? Yes: NPRS scale: 3-6/10 this month Pain location: Rt > Lt knee Pain description: Stiffness, grinding Aggravating factors: Walking, house chores, getting down on her knees Relieving factors: PRP, brace, elevation  PRECAUTIONS: Other: Quilla has a history of chronic low back pain with radiculopathy that is currently being managed with her at home physical therapy.  The aware of this when prescribing activities for her arthritic knees.  RED FLAGS: None   WEIGHT BEARING RESTRICTIONS: No  FALLS:  Has patient fallen in last 6 months? No  LIVING ENVIRONMENT: Lives with: lives with their family and lives with their spouse Lives in: House/apartment Stairs: Avoids stairs Has following equipment at home: None  OCCUPATION: Retired  PLOF: Independent with community mobility with device  PATIENT GOALS: Walk more, help care for family, do longer rides in the car  NEXT MD VISIT: Not presently  OBJECTIVE:  Note: Objective measures were completed at Evaluation unless otherwise noted.  DIAGNOSTIC FINDINGS: AP lateral radiographs right knee reviewed.  Severe end-stage arthritis is  present worse in the medial compartment with only about 1 mm of joint  space remaining.  Lateral and patellofemoral compartments  have moderate  arthritis.  Slight varus alignment noted.  No acute fracture  PATIENT SURVEYS:  PSFS: THE PATIENT SPECIFIC FUNCTIONAL SCALE  Place score of 0-10 (0 = unable to perform activity and 10 = able to perform activity at the same level as before injury or problem)  Activity Date: 01/17/2024    Walking 4    2.  Cleaning the house 5    3.  Writing longer distances in the car 6    4.  Sleeping 4    5.  Getting on and off the floor 2    Total Score 4.2      Total Score = Sum of activity scores/number of  activities  Minimally Detectable Change: 3 points (for single activity); 2 points (for average score)  [facility] (nd). The Patient Specific Functional Scale . Retrieved from [email or web address]   COGNITION: Overall cognitive status: Within functional limits for tasks assessed     SENSATION: Nothing radicular recently, has had sciatica previously  EDEMA:  Noted and not objectively assessed   LOWER EXTREMITY ROM:  Active ROM Left/Right 01/17/2024   Hip flexion    Hip extension    Hip abduction    Hip adduction    Hip internal rotation    Hip external rotation    Knee flexion 131/120   Knee extension 0/-4   Ankle dorsiflexion    Ankle plantarflexion    Ankle inversion    Ankle eversion     (Blank rows = not tested)  LOWER EXTREMITY STRENGTH:  Assessed in pounds with hand-held dynamometer Left/Right 01/17/2024   Hip flexion    Hip extension    Hip abduction    Hip adduction    Hip internal rotation    Hip external rotation    Knee flexion    Knee extension 68.5/53.9   Ankle dorsiflexion    Ankle plantarflexion    Ankle inversion    Ankle eversion     (Blank rows = not tested)  GAIT: Distance walked: 100 feet Assistive device utilized: None Level of assistance: Complete Independence Comments: Erica Thornton notes she would like to return to walking for exercise to help manage her back pain and sciatica.  She has not been walking since before her PRP treatment.  Her                                                                                                                             TREATMENT DATE:  01/17/2024 Supine quadriceps sets 2 sets of 10 for 5 seconds Seated straight leg raises 2 sets of 10 for 3 seconds (feedback and correction needed for proper exercise technique)  97535: Reviewed examination findings; imaging and discussed the focus of physical therapy on strengthening (particularly  quadriceps) for joint preservation and to prepare for what will likely be a knee replacement in the future.  PATIENT EDUCATION:  Education details: See above Person educated: Patient Education method: Explanation, Demonstration, Actor  cues, Verbal cues, and Handouts Education comprehension: verbalized understanding, returned demonstration, verbal cues required, tactile cues required, and needs further education  HOME EXERCISE PROGRAM: Access Code: [email or web address] URL: [email or web address] Date: 01/17/2024 Prepared by: Lamar Ivory  Exercises - Supine Quadricep Sets  - 5 x daily - 7 x weekly - 2 sets - 10 reps - 5 second hold - Seated Straight Leg Raise   - 2 x daily - 7 x weekly - 5 sets - 5 reps - 2 seconds hold  ASSESSMENT:  CLINICAL IMPRESSION: Patient is a 63 y.o. female who was seen today for physical therapy evaluation and treatment for  M25.561 (ICD-10-CM) - Right knee pain, unspecified chronicity  .  Erica Thornton notes she is also managing low back pain with radiculopathy but this seems to be going well with her current physical therapy at home.  She knows that she is a knee replacement candidate for the right side but she would like to delay this as long as possible given her recent investment in PRP and her desire to go to as long as possible before needing to do a knee replacement.  Significant objective findings included limited right knee active range of motion and quadriceps strength that will be addressed with her supervised physical therapy.  OBJECTIVE IMPAIRMENTS: Abnormal gait, decreased activity tolerance, decreased endurance, decreased mobility, difficulty walking, decreased ROM, decreased strength, increased edema, impaired perceived functional ability, and pain.   ACTIVITY LIMITATIONS: bending, sitting, standing, squatting, sleeping, stairs, dressing, and locomotion level  PARTICIPATION LIMITATIONS: meal prep, cleaning, driving, and community  activity  PERSONAL FACTORS: Hashimoto's disease, migraines, cancer, very arthritic knees are also affecting patient's functional outcome.   REHAB POTENTIAL: Good  CLINICAL DECISION MAKING: Evolving/moderate complexity  EVALUATION COMPLEXITY: Moderate   GOALS: Goals reviewed with patient? Yes  SHORT TERM GOALS: Target date: 02/14/2024 Erica Thornton will be independent with her day 1 home exercises Baseline: Started 01/17/2024 Goal status: INITIAL  2.  Improve right knee flexion active range of motion to 0 - 2 - 125 degrees Baseline: 0 - 4 - 120 degrees Goal status: INITIAL  3.  Improve right quadriceps strength to at least 60 pounds Baseline: 53.9 pounds Goal status: INITIAL   LONG TERM GOALS: Target date: 03/13/2024  Improve patient-specific functional score to 6.5 Baseline: 4.2 Goal status: INITIAL  2.  Erica Thornton will report bilateral knee pain consistently 0-4/10 on the visual analog scale Baseline: 3-6/10 Goal status: INITIAL  3.  Improve bilateral quadriceps strength to 80+ pounds Baseline: 68.5/53.9 pounds Goal status: INITIAL  4.  Erica Thornton will be able to walk 20+ minutes 3 days a week without being stopped by increasing knee pain Baseline: Not walking for exercise Goal status: INITIAL  5.  Erica Thornton will be independent with a long-term maintenance home exercise program at discharge Baseline: Started 01/17/2024 Goal status: INITIAL   PLAN:  PT FREQUENCY: 1-2x/week  PT DURATION: 8 weeks  PLANNED INTERVENTIONS: 97750- Physical Performance Testing, 97110-Therapeutic exercises, 97530- Therapeutic activity, 97112- Neuromuscular re-education, 97535- Self Care, 02859- Manual therapy, [phone]- Gait training, [phone]- Electrical stimulation (unattended), [id number]- Vasopneumatic device, Patient/Family education, Balance training, Stair training, Joint mobilization, Cryotherapy, and Moist heat  PLAN FOR NEXT SESSION: NO anti-inflammatories until at least January 2026 secondary to recent  PRP.  Emphasis on lower extremity strengthening, quadriceps emphasis as Erica Thornton is a TKA candidate who is looking to delay surgery as long as possible.   Myer LELON Ivory, PT, MPT 01/17/2024, 5:00 PM  "

## 2024-02-03 ENCOUNTER — Ambulatory Visit: Admitting: Physical Therapy

## 2024-02-03 ENCOUNTER — Encounter: Payer: Self-pay | Admitting: Physical Therapy

## 2024-02-03 DIAGNOSIS — R262 Difficulty in walking, not elsewhere classified: Secondary | ICD-10-CM | POA: Diagnosis not present

## 2024-02-03 DIAGNOSIS — M25562 Pain in left knee: Secondary | ICD-10-CM

## 2024-02-03 DIAGNOSIS — G8929 Other chronic pain: Secondary | ICD-10-CM | POA: Diagnosis not present

## 2024-02-03 DIAGNOSIS — M25661 Stiffness of right knee, not elsewhere classified: Secondary | ICD-10-CM

## 2024-02-03 DIAGNOSIS — M25561 Pain in right knee: Secondary | ICD-10-CM

## 2024-02-03 DIAGNOSIS — R600 Localized edema: Secondary | ICD-10-CM | POA: Diagnosis not present

## 2024-02-03 DIAGNOSIS — M6281 Muscle weakness (generalized): Secondary | ICD-10-CM

## 2024-02-03 NOTE — Therapy (Signed)
 " OUTPATIENT PHYSICAL THERAPY LOWER EXTREMITY TREATMENT   Patient Name: Erica Thornton MRN: [id number] DOB:[birth year], 64 y.o., female Today's Date: 02/03/2024  END OF SESSION:  PT End of Session - 02/03/24 1335     Visit Number 2    Number of Visits 16    Date for Recertification  03/13/24    Authorization Type [employer]    Progress Note Due on Visit 16    PT Start Time 1335    PT Stop Time 1422    PT Time Calculation (min) 47 min    Activity Tolerance Patient tolerated treatment well;No increased pain    Behavior During Therapy WFL for tasks assessed/performed           Past Medical History:  Diagnosis Date   Cancer (HCC)    Diverticulitis    Hashimoto's disease    Migraine    Past Surgical History:  Procedure Laterality Date   BREAST CYST ASPIRATION     GALLBLADDER SURGERY     HAND SURGERY     HERNIA REPAIR     VAGINAL HYSTERECTOMY     There are no active problems to display for this patient.   PCP: Erica Gell, MD  REFERRING PROVIDER: Cordella Glendia Hutchinson, MD  REFERRING DIAG:  [phone] (ICD-10-CM) - Right knee pain, unspecified chronicity   THERAPY DIAG:  Difficulty in walking, not elsewhere classified  Localized edema  Muscle weakness (generalized)  Stiffness of right knee, not elsewhere classified  Chronic pain of left knee  Chronic pain of right knee  Rationale for Evaluation and Treatment: Rehabilitation  ONSET DATE: Chronic  SUBJECTIVE:   SUBJECTIVE STATEMENT: The exercises are going well but flared up right knee.  Her back also flared up.    PERTINENT HISTORY: Hashimoto's disease, migraines, cancer, very arthritic knees   PAIN:  Are you having pain? Yes: NPRS scale: since last PT lowest 2-4/10 highest 10/10 Pain location: Rt > Lt knee Pain description: Stiffness, grinding Aggravating factors: Walking, house chores, getting down on her knees Relieving factors: PRP, brace, elevation  PRECAUTIONS: Other: Erica Thornton has a history of chronic  low back pain with radiculopathy that is currently being managed with her at home physical therapy.  The aware of this when prescribing activities for her arthritic knees.  RED FLAGS: None   WEIGHT BEARING RESTRICTIONS: No  FALLS:  Has patient fallen in last 6 months? No  LIVING ENVIRONMENT: Lives with: lives with their family and lives with their spouse Lives in: House/apartment Stairs: Avoids stairs Has following equipment at home: None  OCCUPATION: Retired  PLOF: Independent with community mobility with device  PATIENT GOALS: Walk more, help care for family, do longer rides in the car  NEXT MD VISIT: Not presently  OBJECTIVE:  Note: Objective measures were completed at Evaluation unless otherwise noted.  DIAGNOSTIC FINDINGS: AP lateral radiographs right knee reviewed.  Severe end-stage arthritis is  present worse in the medial compartment with only about 1 mm of joint  space remaining.  Lateral and patellofemoral compartments have moderate  arthritis.  Slight varus alignment noted.  No acute fracture  PATIENT SURVEYS:  PSFS: THE PATIENT SPECIFIC FUNCTIONAL SCALE  Place score of 0-10 (0 = unable to perform activity and 10 = able to perform activity at the same level as before injury or problem)  Activity Date: 01/17/2024    Walking 4    2.  Cleaning the house 5    3.  Writing longer distances in the car 6  4.  Sleeping 4    5.  Getting on and off the floor 2    Total Score 4.2      Total Score = Sum of activity scores/number of activities  Minimally Detectable Change: 3 points (for single activity); 2 points (for average score)  [facility] (nd). The Patient Specific Functional Scale . Retrieved from [email or web address]   COGNITION: Overall cognitive status: Within functional limits for tasks assessed     SENSATION: Nothing radicular recently, has had sciatica previously  EDEMA:  Noted and  not objectively assessed   LOWER EXTREMITY ROM:  Active ROM Left/Right 01/17/2024   Hip flexion    Hip extension    Hip abduction    Hip adduction    Hip internal rotation    Hip external rotation    Knee flexion 131/120   Knee extension 0/-4   Ankle dorsiflexion    Ankle plantarflexion    Ankle inversion    Ankle eversion     (Blank rows = not tested)  LOWER EXTREMITY STRENGTH:  Assessed in pounds with hand-held dynamometer Left/Right 01/17/2024   Hip flexion    Hip extension    Hip abduction    Hip adduction    Hip internal rotation    Hip external rotation    Knee flexion    Knee extension 68.5/53.9   Ankle dorsiflexion    Ankle plantarflexion    Ankle inversion    Ankle eversion     (Blank rows = not tested)  GAIT: Distance walked: 100 feet Assistive device utilized: None Level of assistance: Complete Independence Comments: Erica Thornton notes she would like to return to walking for exercise to help manage her back pain and sciatica.  She has not been walking since before her PRP treatment.  Her                                                                                                                             TREATMENT DATE:  02/03/2024 Therapeutic Exercise: PT verbally educated pt that well-rounded fitness plan addresses muscle endurance, flexibility, strength & balance. Pt verbalized understanding of concept.   recumbent bike seat 6 level 1 for 8 min. PT explained benefits for recumbent bike & set up.  Pt verbalized understanding.   Therapeutic Activities:   PT demo & verbal cues on sit to/from stand technique with focus on keeping weight over feet while raising & lowering to chair.  Pt performed multiple reps during session including 18 chair without armrest with PT occasional cues for technique reminder.  PT explained benefits to using rollator walker to enable increase in walking distances with rest option as needed and transporting weighted objects like  laundry. PT demo & verbal cues on sit to/from stand to rollator from standard chair, sit to/from stand to rollator seat, proper hand position on brakes, proper location of his body/feet to rollator while ambulating, turning with rollator walker and negotiating ramp & curb.  Patient performed all with PT in clinic and verbalized better understanding.   PT demo & verbal cues in use of 24 bar stool for modified standing activities to build tolerance for standing and sit to/from stand technique.  Pt verbalized and return demo understanding.    TREATMENT DATE:  01/17/2024 Supine quadriceps sets 2 sets of 10 for 5 seconds Seated straight leg raises 2 sets of 10 for 3 seconds (feedback and correction needed for proper exercise technique)  97535: Reviewed examination findings; imaging and discussed the focus of physical therapy on strengthening (particularly quadriceps) for joint preservation and to prepare for what will likely be a knee replacement in the future.  PATIENT EDUCATION:  Education details: See above Person educated: Patient Education method: Explanation, Demonstration, Tactile cues, Verbal cues, and Handouts Education comprehension: verbalized understanding, returned demonstration, verbal cues required, tactile cues required, and needs further education  HOME EXERCISE PROGRAM: Access Code: [email or web address] URL: [email or web address] Date: 01/17/2024 Prepared by: Lamar Ivory  Exercises - Supine Quadricep Sets  - 5 x daily - 7 x weekly - 2 sets - 10 reps - 5 second hold - Seated Straight Leg Raise   - 2 x daily - 7 x weekly - 5 sets - 5 reps - 2 seconds hold  ASSESSMENT:  CLINICAL IMPRESSION:  Patient appears to understand rationale and safe use of rollator walker.  The rollator walker should enable her to progressively increase her ambulation distance without stress to knees and back.  Patient also improved her ability for sit to and from stand with PT instruction and  technique.  Patient continues to benefit from skilled PT to improve knee function with decreased pain.   OBJECTIVE IMPAIRMENTS: Abnormal gait, decreased activity tolerance, decreased endurance, decreased mobility, difficulty walking, decreased ROM, decreased strength, increased edema, impaired perceived functional ability, and pain.   ACTIVITY LIMITATIONS: bending, sitting, standing, squatting, sleeping, stairs, dressing, and locomotion level  PARTICIPATION LIMITATIONS: meal prep, cleaning, driving, and community activity  PERSONAL FACTORS: Hashimoto's disease, migraines, cancer, very arthritic knees are also affecting patient's functional outcome.   REHAB POTENTIAL: Good  CLINICAL DECISION MAKING: Evolving/moderate complexity  EVALUATION COMPLEXITY: Moderate   GOALS: Goals reviewed with patient? Yes  SHORT TERM GOALS: Target date: 02/14/2024 Madisyn will be independent with her day 1 home exercises Baseline: Started 01/17/2024 Goal status: Ongoing  02/03/2024  2.  Improve right knee flexion active range of motion to 0 - 2 - 125 degrees Baseline: 0 - 4 - 120 degrees Goal status: Ongoing  02/03/2024  3.  Improve right quadriceps strength to at least 60 pounds Baseline: 53.9 pounds Goal status: Ongoing  02/03/2024   LONG TERM GOALS: Target date: 03/13/2024  Improve patient-specific functional score to 6.5 Baseline: 4.2 Goal status: Ongoing  02/03/2024  2.  Cannon will report bilateral knee pain consistently 0-4/10 on the visual analog scale Baseline: 3-6/10 Goal status: Ongoing  02/03/2024  3.  Improve bilateral quadriceps strength to 80+ pounds Baseline: 68.5/53.9 pounds Goal status: Ongoing  02/03/2024  4.  Blaklee will be able to walk 20+ minutes 3 days a week without being stopped by increasing knee pain Baseline: Not walking for exercise Goal status: Ongoing  02/03/2024  5.  Safire will be independent with a long-term maintenance home exercise program at discharge Baseline:  Started 01/17/2024 Goal status: Ongoing  02/03/2024   PLAN:  PT FREQUENCY: 1-2x/week  PT DURATION: 8 weeks  PLANNED INTERVENTIONS: 97750- Physical Performance Testing, 97110-Therapeutic exercises, 97530- Therapeutic activity, [id number]- Neuromuscular re-education, 97535- Self  Care, 02859- Manual therapy, [phone]- Gait training, [phone]- Electrical stimulation (unattended), [phone]- Vasopneumatic device, Patient/Family education, Balance training, Stair training, Joint mobilization, Cryotherapy, and Moist heat  PLAN FOR NEXT SESSION: Add flexibility stretches to HEP including hamstring, gastroc and quads. Progress functional activities.  NO anti-inflammatories until at least January 2026 secondary to recent PRP.     Chevie Birkhead, PT, DPT 02/03/2024, 4:50 PM  "

## 2024-02-04 NOTE — Therapy (Signed)
 " OUTPATIENT PHYSICAL THERAPY LOWER EXTREMITY TREATMENT   Patient Name: Erica Thornton MRN: [id number] DOB:[birth year], 64 y.o., female Today's Date: 02/05/2024  END OF SESSION:  PT End of Session - 02/05/24 1347     Visit Number 3    Number of Visits 16    Date for Recertification  03/13/24    Authorization Type [employer]    Progress Note Due on Visit 16    PT Start Time 1347    PT Stop Time 1430    PT Time Calculation (min) 43 min    Activity Tolerance Patient tolerated treatment well;No increased pain    Behavior During Therapy WFL for tasks assessed/performed            Past Medical History:  Diagnosis Date   Cancer (HCC)    Diverticulitis    Hashimoto's disease    Migraine    Past Surgical History:  Procedure Laterality Date   BREAST CYST ASPIRATION     GALLBLADDER SURGERY     HAND SURGERY     HERNIA REPAIR     VAGINAL HYSTERECTOMY     There are no active problems to display for this patient.   PCP: Gaither Gell, MD  REFERRING PROVIDER: Cordella Glendia Hutchinson, MD  REFERRING DIAG:  [phone] (ICD-10-CM) - Right knee pain, unspecified chronicity   THERAPY DIAG:  Difficulty in walking, not elsewhere classified  Localized edema  Muscle weakness (generalized)  Stiffness of right knee, not elsewhere classified  Chronic pain of left knee  Chronic pain of right knee  Rationale for Evaluation and Treatment: Rehabilitation  ONSET DATE: Chronic  SUBJECTIVE:   SUBJECTIVE STATEMENT: Patient reports that she has been improving since last session but doesn't feel the need to use the walker just yet.   PERTINENT HISTORY: Hashimoto's disease, migraines, cancer, very arthritic knees   PAIN:  Are you having pain? Yes: NPRS scale: 2-3/10 Rt knee  Pain location: Rt > Lt knee Pain description: Stiffness, grinding Aggravating factors: Walking, house chores, getting down on her knees Relieving factors: PRP, brace, elevation  PRECAUTIONS: Other: Linnet has a history of  chronic low back pain with radiculopathy that is currently being managed with her at home physical therapy.  The aware of this when prescribing activities for her arthritic knees.  RED FLAGS: None   WEIGHT BEARING RESTRICTIONS: No  FALLS:  Has patient fallen in last 6 months? No  LIVING ENVIRONMENT: Lives with: lives with their family and lives with their spouse Lives in: House/apartment Stairs: Avoids stairs Has following equipment at home: None  OCCUPATION: Retired  PLOF: Independent with community mobility with device  PATIENT GOALS: Walk more, help care for family, do longer rides in the car  NEXT MD VISIT: Not presently  OBJECTIVE:  Note: Objective measures were completed at Evaluation unless otherwise noted.  DIAGNOSTIC FINDINGS: AP lateral radiographs right knee reviewed.  Severe end-stage arthritis is  present worse in the medial compartment with only about 1 mm of joint  space remaining.  Lateral and patellofemoral compartments have moderate  arthritis.  Slight varus alignment noted.  No acute fracture  PATIENT SURVEYS:  PSFS: THE PATIENT SPECIFIC FUNCTIONAL SCALE  Place score of 0-10 (0 = unable to perform activity and 10 = able to perform activity at the same level as before injury or problem)  Activity Date: 01/17/2024    Walking 4    2.  Cleaning the house 5    3.  Writing longer distances in the car  6    4.  Sleeping 4    5.  Getting on and off the floor 2    Total Score 4.2      Total Score = Sum of activity scores/number of activities  Minimally Detectable Change: 3 points (for single activity); 2 points (for average score)  [facility] (nd). The Patient Specific Functional Scale . Retrieved from [email or web address]   COGNITION: Overall cognitive status: Within functional limits for tasks assessed     SENSATION: Nothing radicular recently, has had sciatica previously  EDEMA:   Noted and not objectively assessed   LOWER EXTREMITY ROM:  Active ROM Left/Right 01/17/2024   Hip flexion    Hip extension    Hip abduction    Hip adduction    Hip internal rotation    Hip external rotation    Knee flexion 131/120   Knee extension 0/-4   Ankle dorsiflexion    Ankle plantarflexion    Ankle inversion    Ankle eversion     (Blank rows = not tested)  LOWER EXTREMITY STRENGTH:  Assessed in pounds with hand-held dynamometer Left/Right 01/17/2024   Hip flexion    Hip extension    Hip abduction    Hip adduction    Hip internal rotation    Hip external rotation    Knee flexion    Knee extension 68.5/53.9   Ankle dorsiflexion    Ankle plantarflexion    Ankle inversion    Ankle eversion     (Blank rows = not tested)  GAIT: Distance walked: 100 feet Assistive device utilized: None Level of assistance: Complete Independence Comments: Sheily notes she would like to return to walking for exercise to help manage her back pain and sciatica.  She has not been walking since before her PRP treatment.  Her                                                                                                                             TREATMENT DATE:  02/05/2024 TherAct:  UBE with bilat LE only no resistance for 8 minutes  Sit<>stands with verbalizing each step 1x10  Step up and over 4 step with bilat UE use 2x10 total   TherEx:  Seated LAQ with 2# ankle weights 2x10 each side with 2s hold and focus on slow eccentric control  Seated hamstring curls with red TB 2x12 each side  PT discussed updated HEP with patient    02/03/2024 Therapeutic Exercise: PT verbally educated pt that well-rounded fitness plan addresses muscle endurance, flexibility, strength & balance. Pt verbalized understanding of concept.   recumbent bike seat 6 level 1 for 8 min. PT explained benefits for recumbent bike & set up.  Pt verbalized understanding.   Therapeutic Activities:   PT demo & verbal  cues on sit to/from stand technique with focus on keeping weight over feet while raising & lowering to chair.  Pt performed multiple reps during session including  18 chair without armrest with PT occasional cues for technique reminder.  PT explained benefits to using rollator walker to enable increase in walking distances with rest option as needed and transporting weighted objects like laundry. PT demo & verbal cues on sit to/from stand to rollator from standard chair, sit to/from stand to rollator seat, proper hand position on brakes, proper location of his body/feet to rollator while ambulating, turning with rollator walker and negotiating ramp & curb.  Patient performed all with PT in clinic and verbalized better understanding.   PT demo & verbal cues in use of 24 bar stool for modified standing activities to build tolerance for standing and sit to/from stand technique.  Pt verbalized and return demo understanding.    TREATMENT DATE:  01/17/2024 Supine quadriceps sets 2 sets of 10 for 5 seconds Seated straight leg raises 2 sets of 10 for 3 seconds (feedback and correction needed for proper exercise technique)  97535: Reviewed examination findings; imaging and discussed the focus of physical therapy on strengthening (particularly quadriceps) for joint preservation and to prepare for what will likely be a knee replacement in the future.  PATIENT EDUCATION:  Education details: See above Person educated: Patient Education method: Explanation, Demonstration, Tactile cues, Verbal cues, and Handouts Education comprehension: verbalized understanding, returned demonstration, verbal cues required, tactile cues required, and needs further education  HOME EXERCISE PROGRAM: Access Code: [email or web address] URL: [email or web address] Date: 02/05/2024 Prepared by: Susannah Daring  Exercises - Supine Quadricep Sets  - 5 x daily - 7 x weekly - 2 sets - 10 reps - 5 second hold - Seated Straight Leg  Raise   - 2 x daily - 7 x weekly - 5 sets - 5 reps - 2 seconds hold - Seated Long Arc Quad with Ankle Weight  - 1 x daily - 7 x weekly - 2 sets - 10 reps - Seated Hamstring Curl with Anchored Resistance  - 1 x daily - 7 x weekly - 2 sets - 10 reps  ASSESSMENT:  CLINICAL IMPRESSION:   Patient arrived to session noting improved symptoms from last session compared to initial session. Patient tolerated all activities this date with only intermittent noted discomfort in Rt medial and proximal knee. Patient will continue to benefit from skilled PT.   OBJECTIVE IMPAIRMENTS: Abnormal gait, decreased activity tolerance, decreased endurance, decreased mobility, difficulty walking, decreased ROM, decreased strength, increased edema, impaired perceived functional ability, and pain.   ACTIVITY LIMITATIONS: bending, sitting, standing, squatting, sleeping, stairs, dressing, and locomotion level  PARTICIPATION LIMITATIONS: meal prep, cleaning, driving, and community activity  PERSONAL FACTORS: Hashimoto's disease, migraines, cancer, very arthritic knees are also affecting patient's functional outcome.   REHAB POTENTIAL: Good  CLINICAL DECISION MAKING: Evolving/moderate complexity  EVALUATION COMPLEXITY: Moderate   GOALS: Goals reviewed with patient? Yes  SHORT TERM GOALS: Target date: 02/14/2024 Emary will be independent with her day 1 home exercises Baseline: Started 01/17/2024 Goal status: Ongoing  02/03/2024  2.  Improve right knee flexion active range of motion to 0 - 2 - 125 degrees Baseline: 0 - 4 - 120 degrees Goal status: Ongoing  02/03/2024  3.  Improve right quadriceps strength to at least 60 pounds Baseline: 53.9 pounds Goal status: Ongoing  02/03/2024   LONG TERM GOALS: Target date: 03/13/2024  Improve patient-specific functional score to 6.5 Baseline: 4.2 Goal status: Ongoing  02/03/2024  2.  Vernestine will report bilateral knee pain consistently 0-4/10 on the visual analog  scale Baseline: 3-6/10 Goal status: Ongoing  02/03/2024  3.  Improve bilateral quadriceps strength to 80+ pounds Baseline: 68.5/53.9 pounds Goal status: Ongoing  02/03/2024  4.  [place] will be able to walk 20+ minutes 3 days a week without being stopped by increasing knee pain Baseline: Not walking for exercise Goal status: Ongoing  02/03/2024  5.  Carliyah will be independent with a long-term maintenance home exercise program at discharge Baseline: Started 01/17/2024 Goal status: Ongoing  02/03/2024   PLAN:  PT FREQUENCY: 1-2x/week  PT DURATION: 8 weeks  PLANNED INTERVENTIONS: 97750- Physical Performance Testing, 97110-Therapeutic exercises, 97530- Therapeutic activity, 97112- Neuromuscular re-education, 97535- Self Care, 02859- Manual therapy, [id number]- Gait training, [phone]- Electrical stimulation (unattended), 97016- Vasopneumatic device, Patient/Family education, Balance training, Stair training, Joint mobilization, Cryotherapy, and Moist heat  PLAN FOR NEXT SESSION:  Add flexibility stretches to HEP including hamstring, gastroc and quads. Progress functional activities.  NO anti-inflammatories until at least January 2026 secondary to recent PRP.     Susannah Daring, PT, DPT 02/05/2024 3:35 PM   "

## 2024-02-05 ENCOUNTER — Ambulatory Visit

## 2024-02-05 DIAGNOSIS — R600 Localized edema: Secondary | ICD-10-CM

## 2024-02-05 DIAGNOSIS — M25661 Stiffness of right knee, not elsewhere classified: Secondary | ICD-10-CM

## 2024-02-05 DIAGNOSIS — M25562 Pain in left knee: Secondary | ICD-10-CM | POA: Diagnosis not present

## 2024-02-05 DIAGNOSIS — M25561 Pain in right knee: Secondary | ICD-10-CM | POA: Diagnosis not present

## 2024-02-05 DIAGNOSIS — G8929 Other chronic pain: Secondary | ICD-10-CM | POA: Diagnosis not present

## 2024-02-05 DIAGNOSIS — M6281 Muscle weakness (generalized): Secondary | ICD-10-CM

## 2024-02-05 DIAGNOSIS — R262 Difficulty in walking, not elsewhere classified: Secondary | ICD-10-CM | POA: Diagnosis not present

## 2024-02-07 ENCOUNTER — Ambulatory Visit
Admission: RE | Admit: 2024-02-07 | Discharge: 2024-02-07 | Disposition: A | Source: Ambulatory Visit | Attending: Family | Admitting: Family

## 2024-02-07 DIAGNOSIS — N6452 Nipple discharge: Secondary | ICD-10-CM

## 2024-02-07 MED ORDER — GADOPICLENOL 0.5 MMOL/ML IV SOLN
9.0000 mL | Freq: Once | INTRAVENOUS | Status: AC | PRN
  Administered 2024-02-07: 9 mL via INTRAVENOUS

## 2024-02-07 NOTE — Therapy (Signed)
 " OUTPATIENT PHYSICAL THERAPY LOWER EXTREMITY TREATMENT   Patient Name: Erica Thornton MRN: [id number] DOB:07/23/1960, 64 y.o., female Today's Date: 02/07/2024  END OF SESSION:      Past Medical History:  Diagnosis Date   Cancer (HCC)    Diverticulitis    Hashimoto's disease    Migraine    Past Surgical History:  Procedure Laterality Date   BREAST CYST ASPIRATION     GALLBLADDER SURGERY     HAND SURGERY     HERNIA REPAIR     VAGINAL HYSTERECTOMY     There are no active problems to display for this patient.   PCP: Gaither Gell, MD  REFERRING PROVIDER: Cordella Glendia Hutchinson, MD  REFERRING DIAG:  [phone] (ICD-10-CM) - Right knee pain, unspecified chronicity   THERAPY DIAG:  No diagnosis found.  Rationale for Evaluation and Treatment: Rehabilitation  ONSET DATE: Chronic  SUBJECTIVE:   SUBJECTIVE STATEMENT: ***   PERTINENT HISTORY: Hashimoto's disease, migraines, cancer, very arthritic knees   PAIN:  Are you having pain? Yes: NPRS scale: 2-3/10 Rt knee  Pain location: Rt > Lt knee Pain description: Stiffness, grinding Aggravating factors: Walking, house chores, getting down on her knees Relieving factors: PRP, brace, elevation  PRECAUTIONS: Other: Peja has a history of chronic low back pain with radiculopathy that is currently being managed with her at home physical therapy.  The aware of this when prescribing activities for her arthritic knees.  RED FLAGS: None   WEIGHT BEARING RESTRICTIONS: No  FALLS:  Has patient fallen in last 6 months? No  LIVING ENVIRONMENT: Lives with: lives with their family and lives with their spouse Lives in: House/apartment Stairs: Avoids stairs Has following equipment at home: None  OCCUPATION: Retired  PLOF: Independent with community mobility with device  PATIENT GOALS: Walk more, help care for family, do longer rides in the car  NEXT MD VISIT: Not presently  OBJECTIVE:  Note: Objective measures were completed  at Evaluation unless otherwise noted.  DIAGNOSTIC FINDINGS: AP lateral radiographs right knee reviewed.  Severe end-stage arthritis is  present worse in the medial compartment with only about 1 mm of joint  space remaining.  Lateral and patellofemoral compartments have moderate  arthritis.  Slight varus alignment noted.  No acute fracture  PATIENT SURVEYS:  PSFS: THE PATIENT SPECIFIC FUNCTIONAL SCALE  Place score of 0-10 (0 = unable to perform activity and 10 = able to perform activity at the same level as before injury or problem)  Activity Date: 01/17/2024    Walking 4    2.  Cleaning the house 5    3.  Writing longer distances in the car 6    4.  Sleeping 4    5.  Getting on and off the floor 2    Total Score 4.2      Total Score = Sum of activity scores/number of activities  Minimally Detectable Change: 3 points (for single activity); 2 points (for average score)  [facility] (nd). The Patient Specific Functional Scale . Retrieved from [email or web address]   COGNITION: Overall cognitive status: Within functional limits for tasks assessed     SENSATION: Nothing radicular recently, has had sciatica previously  EDEMA:  Noted and not objectively assessed   LOWER EXTREMITY ROM:  Active ROM Left/Right 01/17/2024   Hip flexion    Hip extension    Hip abduction    Hip adduction    Hip internal rotation    Hip external rotation  Knee flexion 131/120   Knee extension 0/-4   Ankle dorsiflexion    Ankle plantarflexion    Ankle inversion    Ankle eversion     (Blank rows = not tested)  LOWER EXTREMITY STRENGTH:  Assessed in pounds with hand-held dynamometer Left/Right 01/17/2024   Hip flexion    Hip extension    Hip abduction    Hip adduction    Hip internal rotation    Hip external rotation    Knee flexion    Knee extension 68.5/53.9   Ankle dorsiflexion    Ankle plantarflexion    Ankle  inversion    Ankle eversion     (Blank rows = not tested)  GAIT: Distance walked: 100 feet Assistive device utilized: None Level of assistance: Complete Independence Comments: Erica Thornton notes she would like to return to walking for exercise to help manage her back pain and sciatica.  She has not been walking since before her PRP treatment.  Her                                                                                                                             TREATMENT DATE:  02/10/2024 ***   02/05/2024 TherAct:  UBE with bilat LE only no resistance for 8 minutes  Sit<>stands with verbalizing each step 1x10  Step up and over 4 step with bilat UE use 2x10 total   TherEx:  Seated LAQ with 2# ankle weights 2x10 each side with 2s hold and focus on slow eccentric control  Seated hamstring curls with red TB 2x12 each side  PT discussed updated HEP with patient    02/03/2024 Therapeutic Exercise: PT verbally educated pt that well-rounded fitness plan addresses muscle endurance, flexibility, strength & balance. Pt verbalized understanding of concept.   recumbent bike seat 6 level 1 for 8 min. PT explained benefits for recumbent bike & set up.  Pt verbalized understanding.   Therapeutic Activities:   PT demo & verbal cues on sit to/from stand technique with focus on keeping weight over feet while raising & lowering to chair.  Pt performed multiple reps during session including 18 chair without armrest with PT occasional cues for technique reminder.  PT explained benefits to using rollator walker to enable increase in walking distances with rest option as needed and transporting weighted objects like laundry. PT demo & verbal cues on sit to/from stand to rollator from standard chair, sit to/from stand to rollator seat, proper hand position on brakes, proper location of his body/feet to rollator while ambulating, turning with rollator walker and negotiating ramp & curb.  Patient performed all  with PT in clinic and verbalized better understanding.   PT demo & verbal cues in use of 24 bar stool for modified standing activities to build tolerance for standing and sit to/from stand technique.  Pt verbalized and return demo understanding.    TREATMENT DATE:  01/17/2024 Supine quadriceps sets 2 sets of 10 for 5  seconds Seated straight leg raises 2 sets of 10 for 3 seconds (feedback and correction needed for proper exercise technique)  97535: Reviewed examination findings; imaging and discussed the focus of physical therapy on strengthening (particularly quadriceps) for joint preservation and to prepare for what will likely be a knee replacement in the future.  PATIENT EDUCATION:  Education details: See above Person educated: Patient Education method: Explanation, Demonstration, Tactile cues, Verbal cues, and Handouts Education comprehension: verbalized understanding, returned demonstration, verbal cues required, tactile cues required, and needs further education  HOME EXERCISE PROGRAM: Access Code: [email or web address] URL: [email or web address] Date: 02/05/2024 Prepared by: Susannah Daring  Exercises - Supine Quadricep Sets  - 5 x daily - 7 x weekly - 2 sets - 10 reps - 5 second hold - Seated Straight Leg Raise   - 2 x daily - 7 x weekly - 5 sets - 5 reps - 2 seconds hold - Seated Long Arc Quad with Ankle Weight  - 1 x daily - 7 x weekly - 2 sets - 10 reps - Seated Hamstring Curl with Anchored Resistance  - 1 x daily - 7 x weekly - 2 sets - 10 reps  ASSESSMENT:  CLINICAL IMPRESSION:   Patient arrived to session noting *** Patient will continue to benefit from skilled PT.   OBJECTIVE IMPAIRMENTS: Abnormal gait, decreased activity tolerance, decreased endurance, decreased mobility, difficulty walking, decreased ROM, decreased strength, increased edema, impaired perceived functional ability, and pain.   ACTIVITY LIMITATIONS: bending, sitting, standing, squatting, sleeping,  stairs, dressing, and locomotion level  PARTICIPATION LIMITATIONS: meal prep, cleaning, driving, and community activity  PERSONAL FACTORS: Hashimoto's disease, migraines, cancer, very arthritic knees are also affecting patient's functional outcome.   REHAB POTENTIAL: Good  CLINICAL DECISION MAKING: Evolving/moderate complexity  EVALUATION COMPLEXITY: Moderate   GOALS: Goals reviewed with patient? Yes  SHORT TERM GOALS: Target date: 02/14/2024 Erica Thornton will be independent with her day 1 home exercises Baseline: Started 01/17/2024 Goal status: Ongoing  02/03/2024  2.  Improve right knee flexion active range of motion to 0 - 2 - 125 degrees Baseline: 0 - 4 - 120 degrees Goal status: Ongoing  02/03/2024  3.  Improve right quadriceps strength to at least 60 pounds Baseline: 53.9 pounds Goal status: Ongoing  02/03/2024   LONG TERM GOALS: Target date: 03/13/2024  Improve patient-specific functional score to 6.5 Baseline: 4.2 Goal status: Ongoing  02/03/2024  2.  Erica Thornton will report bilateral knee pain consistently 0-4/10 on the visual analog scale Baseline: 3-6/10 Goal status: Ongoing  02/03/2024  3.  Improve bilateral quadriceps strength to 80+ pounds Baseline: 68.5/53.9 pounds Goal status: Ongoing  02/03/2024  4.  Erica Thornton will be able to walk 20+ minutes 3 days a week without being stopped by increasing knee pain Baseline: Not walking for exercise Goal status: Ongoing  02/03/2024  5.  Erica Thornton will be independent with a long-term maintenance home exercise program at discharge Baseline: Started 01/17/2024 Goal status: Ongoing  02/03/2024   PLAN:  PT FREQUENCY: 1-2x/week  PT DURATION: 8 weeks  PLANNED INTERVENTIONS: 97750- Physical Performance Testing, 97110-Therapeutic exercises, 97530- Therapeutic activity, 97112- Neuromuscular re-education, 97535- Self Care, 02859- Manual therapy, [id number]- Gait training, [phone]- Electrical stimulation (unattended), 97016- Vasopneumatic device,  Patient/Family education, Balance training, Stair training, Joint mobilization, Cryotherapy, and Moist heat  PLAN FOR NEXT SESSION:  *** Add flexibility stretches to HEP including hamstring, gastroc and quads. Progress functional activities.  NO anti-inflammatories until at least January 2026 secondary to recent PRP.     Candance Bohlman  Delores, PT, DPT 02/07/2024 3:32 PM   "

## 2024-02-10 ENCOUNTER — Ambulatory Visit

## 2024-02-10 DIAGNOSIS — M6281 Muscle weakness (generalized): Secondary | ICD-10-CM

## 2024-02-10 DIAGNOSIS — M25562 Pain in left knee: Secondary | ICD-10-CM | POA: Diagnosis not present

## 2024-02-10 DIAGNOSIS — R600 Localized edema: Secondary | ICD-10-CM

## 2024-02-10 DIAGNOSIS — R262 Difficulty in walking, not elsewhere classified: Secondary | ICD-10-CM | POA: Diagnosis not present

## 2024-02-10 DIAGNOSIS — M25561 Pain in right knee: Secondary | ICD-10-CM

## 2024-02-10 DIAGNOSIS — G8929 Other chronic pain: Secondary | ICD-10-CM

## 2024-02-10 DIAGNOSIS — M25661 Stiffness of right knee, not elsewhere classified: Secondary | ICD-10-CM

## 2024-02-13 ENCOUNTER — Encounter: Admitting: Rehabilitative and Restorative Service Providers"

## 2024-02-17 NOTE — Therapy (Signed)
 " OUTPATIENT PHYSICAL THERAPY LOWER EXTREMITY TREATMENT   Patient Name: Erica Thornton MRN: [id number] DOB:[birth year], 64 y.o., female Today's Date: 02/17/2024  END OF SESSION:       Past Medical History:  Diagnosis Date   Cancer (HCC)    Diverticulitis    Hashimoto's disease    Migraine    Past Surgical History:  Procedure Laterality Date   BREAST CYST ASPIRATION     GALLBLADDER SURGERY     HAND SURGERY     HERNIA REPAIR     VAGINAL HYSTERECTOMY     There are no active problems to display for this patient.   PCP: Gaither Gell, MD  REFERRING PROVIDER: Cordella Glendia Hutchinson, MD  REFERRING DIAG:  [phone] (ICD-10-CM) - Right knee pain, unspecified chronicity   THERAPY DIAG:  No diagnosis found.  Rationale for Evaluation and Treatment: Rehabilitation  ONSET DATE: Chronic  SUBJECTIVE:   SUBJECTIVE STATEMENT: *** Patient reports that her knee has the least amount of swelling today, though her neck is feeling stiff.    PERTINENT HISTORY: Hashimoto's disease, migraines, cancer, very arthritic knees   PAIN:  Are you having pain? Yes: NPRS scale: 4-5/10 Rt knee  Pain location: Rt > Lt knee Pain description: Stiffness, grinding Aggravating factors: Walking, house chores, getting down on her knees Relieving factors: PRP, brace, elevation  PRECAUTIONS: Other: Erica Thornton has a history of chronic low back pain with radiculopathy that is currently being managed with her at home physical therapy.  The aware of this when prescribing activities for her arthritic knees.  RED FLAGS: None   WEIGHT BEARING RESTRICTIONS: No  FALLS:  Has patient fallen in last 6 months? No  LIVING ENVIRONMENT: Lives with: lives with their family and lives with their spouse Lives in: House/apartment Stairs: Avoids stairs Has following equipment at home: None  OCCUPATION: Retired  PLOF: Independent with community mobility with device  PATIENT GOALS: Walk more, help care for family, do  longer rides in the car  NEXT MD VISIT: Not presently  OBJECTIVE:  Note: Objective measures were completed at Evaluation unless otherwise noted.  DIAGNOSTIC FINDINGS: AP lateral radiographs right knee reviewed.  Severe end-stage arthritis is  present worse in the medial compartment with only about 1 mm of joint  space remaining.  Lateral and patellofemoral compartments have moderate  arthritis.  Slight varus alignment noted.  No acute fracture  PATIENT SURVEYS:  PSFS: THE PATIENT SPECIFIC FUNCTIONAL SCALE  Place score of 0-10 (0 = unable to perform activity and 10 = able to perform activity at the same level as before injury or problem)  Activity Date: 01/17/2024    Walking 4    2.  Cleaning the house 5    3.  Writing longer distances in the car 6    4.  Sleeping 4    5.  Getting on and off the floor 2    Total Score 4.2      Total Score = Sum of activity scores/number of activities  Minimally Detectable Change: 3 points (for single activity); 2 points (for average score)  [facility] (nd). The Patient Specific Functional Scale . Retrieved from [email or web address]   COGNITION: Overall cognitive status: Within functional limits for tasks assessed     SENSATION: Nothing radicular recently, has had sciatica previously  EDEMA:  Noted and not objectively assessed   LOWER EXTREMITY ROM:  Active ROM Left/Right 01/17/2024   Hip flexion    Hip extension    Hip abduction  Hip adduction    Hip internal rotation    Hip external rotation    Knee flexion 131/120   Knee extension 0/-4   Ankle dorsiflexion    Ankle plantarflexion    Ankle inversion    Ankle eversion     (Blank rows = not tested)  LOWER EXTREMITY STRENGTH:  Assessed in pounds with hand-held dynamometer Left/Right 01/17/2024   Hip flexion    Hip extension    Hip abduction    Hip adduction    Hip internal rotation    Hip external  rotation    Knee flexion    Knee extension 68.5/53.9   Ankle dorsiflexion    Ankle plantarflexion    Ankle inversion    Ankle eversion     (Blank rows = not tested)  GAIT: Distance walked: 100 feet Assistive device utilized: None Level of assistance: Complete Independence Comments: Erica Thornton notes she would like to return to walking for exercise to help manage her back pain and sciatica.  She has not been walking since before her PRP treatment.  Her                                                                                                                             TREATMENT DATE:  02/18/2024 ***   02/10/2024 TherAct:  Recumbent bike level 3 for 8 minutes  Sit<>Stands with staggered stance 1x10 with each leg back   TherEx:  Knee extension cable machine with bilat up, unilat down 2x12  Seated hamstring curls with green TB 2x15 each side  PT discussed performing these at home standing with ankle weights and demonstrated appropriate form  PT discussed how pain and edema can affect quad activation  PT discussed concentric vs eccentric strengthening    02/05/2024 TherAct:  UBE with bilat LE only no resistance for 8 minutes  Sit<>stands with verbalizing each step 1x10  Step up and over 4 step with bilat UE use 2x10 total   TherEx:  Seated LAQ with 2# ankle weights 2x10 each side with 2s hold and focus on slow eccentric control  Seated hamstring curls with red TB 2x12 each side  PT discussed updated HEP with patient    02/03/2024 Therapeutic Exercise: PT verbally educated pt that well-rounded fitness plan addresses muscle endurance, flexibility, strength & balance. Pt verbalized understanding of concept.   recumbent bike seat 6 level 1 for 8 min. PT explained benefits for recumbent bike & set up.  Pt verbalized understanding.   Therapeutic Activities:   PT demo & verbal cues on sit to/from stand technique with focus on keeping weight over feet while raising & lowering to  chair.  Pt performed multiple reps during session including 18 chair without armrest with PT occasional cues for technique reminder.  PT explained benefits to using rollator walker to enable increase in walking distances with rest option as needed and transporting weighted objects like laundry. PT demo & verbal cues on sit to/from  stand to rollator from standard chair, sit to/from stand to rollator seat, proper hand position on brakes, proper location of his body/feet to rollator while ambulating, turning with rollator walker and negotiating ramp & curb.  Patient performed all with PT in clinic and verbalized better understanding.   PT demo & verbal cues in use of 24 bar stool for modified standing activities to build tolerance for standing and sit to/from stand technique.  Pt verbalized and return demo understanding.    TREATMENT DATE:  01/17/2024 Supine quadriceps sets 2 sets of 10 for 5 seconds Seated straight leg raises 2 sets of 10 for 3 seconds (feedback and correction needed for proper exercise technique)  97535: Reviewed examination findings; imaging and discussed the focus of physical therapy on strengthening (particularly quadriceps) for joint preservation and to prepare for what will likely be a knee replacement in the future.  PATIENT EDUCATION:  Education details: See above Person educated: Patient Education method: Explanation, Demonstration, Tactile cues, Verbal cues, and Handouts Education comprehension: verbalized understanding, returned demonstration, verbal cues required, tactile cues required, and needs further education  HOME EXERCISE PROGRAM: Access Code: [email or web address] URL: [email or web address] Date: 02/05/2024 Prepared by: Susannah Daring  Exercises - Supine Quadricep Sets  - 5 x daily - 7 x weekly - 2 sets - 10 reps - 5 second hold - Seated Straight Leg Raise   - 2 x daily - 7 x weekly - 5 sets - 5 reps - 2 seconds hold - Seated Long Arc Quad with Ankle  Weight  - 1 x daily - 7 x weekly - 2 sets - 10 reps - Seated Hamstring Curl with Anchored Resistance  - 1 x daily - 7 x weekly - 2 sets - 10 reps  ASSESSMENT:  CLINICAL IMPRESSION:   Patient arrived to session noting ***. Patient will continue to benefit from skilled PT.   OBJECTIVE IMPAIRMENTS: Abnormal gait, decreased activity tolerance, decreased endurance, decreased mobility, difficulty walking, decreased ROM, decreased strength, increased edema, impaired perceived functional ability, and pain.   ACTIVITY LIMITATIONS: bending, sitting, standing, squatting, sleeping, stairs, dressing, and locomotion level  PARTICIPATION LIMITATIONS: meal prep, cleaning, driving, and community activity  PERSONAL FACTORS: Hashimoto's disease, migraines, cancer, very arthritic knees are also affecting patient's functional outcome.   REHAB POTENTIAL: Good  CLINICAL DECISION MAKING: Evolving/moderate complexity  EVALUATION COMPLEXITY: Moderate   GOALS: Goals reviewed with patient? Yes  SHORT TERM GOALS: Target date: 02/14/2024 Erica Thornton will be independent with her day 1 home exercises Baseline: Started 01/17/2024 Goal status: Ongoing  02/03/2024  2.  Improve right knee flexion active range of motion to 0 - 2 - 125 degrees Baseline: 0 - 4 - 120 degrees Goal status: Ongoing  02/03/2024  3.  Improve right quadriceps strength to at least 60 pounds Baseline: 53.9 pounds Goal status: Ongoing  02/03/2024   LONG TERM GOALS: Target date: 03/13/2024  Improve patient-specific functional score to 6.5 Baseline: 4.2 Goal status: Ongoing  02/03/2024  2.  Erica Thornton will report bilateral knee pain consistently 0-4/10 on the visual analog scale Baseline: 3-6/10 Goal status: Ongoing  02/03/2024  3.  Improve bilateral quadriceps strength to 80+ pounds Baseline: 68.5/53.9 pounds Goal status: Ongoing  02/03/2024  4.  Erica Thornton will be able to walk 20+ minutes 3 days a week without being stopped by increasing knee  pain Baseline: Not walking for exercise Goal status: Ongoing  02/03/2024  5.  Erica Thornton will be independent with a long-term maintenance home exercise program at discharge Baseline: Started 01/17/2024  Goal status: Ongoing  02/03/2024   PLAN:  PT FREQUENCY: 1-2x/week  PT DURATION: 8 weeks  PLANNED INTERVENTIONS: 97750- Physical Performance Testing, 97110-Therapeutic exercises, 97530- Therapeutic activity, 97112- Neuromuscular re-education, 97535- Self Care, [id number]- Manual therapy, [phone]- Gait training, [phone]- Electrical stimulation (unattended), [id number]- Vasopneumatic device, Patient/Family education, Balance training, Stair training, Joint mobilization, Cryotherapy, and Moist heat  PLAN FOR NEXT SESSION:   *** Add flexibility stretches to HEP including hamstring, gastroc and quads. Progress functional activities.  NO anti-inflammatories until at least January 2026 secondary to recent PRP.     Susannah Daring, PT, DPT 02/17/24 10:56 AM   "

## 2024-02-18 ENCOUNTER — Ambulatory Visit

## 2024-02-18 DIAGNOSIS — M25561 Pain in right knee: Secondary | ICD-10-CM

## 2024-02-18 DIAGNOSIS — M6281 Muscle weakness (generalized): Secondary | ICD-10-CM

## 2024-02-18 DIAGNOSIS — R600 Localized edema: Secondary | ICD-10-CM | POA: Diagnosis not present

## 2024-02-18 DIAGNOSIS — M25661 Stiffness of right knee, not elsewhere classified: Secondary | ICD-10-CM | POA: Diagnosis not present

## 2024-02-18 DIAGNOSIS — R262 Difficulty in walking, not elsewhere classified: Secondary | ICD-10-CM | POA: Diagnosis not present

## 2024-02-18 DIAGNOSIS — G8929 Other chronic pain: Secondary | ICD-10-CM | POA: Diagnosis not present

## 2024-02-18 DIAGNOSIS — M25562 Pain in left knee: Secondary | ICD-10-CM | POA: Diagnosis not present

## 2024-02-20 ENCOUNTER — Encounter: Admitting: Rehabilitative and Restorative Service Providers"
